# Patient Record
Sex: Male | Born: 1960 | Race: White | Hispanic: No | Marital: Married | Smoking: Never smoker
Health system: Southern US, Community
[De-identification: ages and names within clinical notes are randomized; demographics above are authoritative.]

## PROBLEM LIST (undated history)

## (undated) DIAGNOSIS — I10 Essential (primary) hypertension: Secondary | ICD-10-CM

## (undated) DIAGNOSIS — J45909 Unspecified asthma, uncomplicated: Secondary | ICD-10-CM

## (undated) DIAGNOSIS — J449 Chronic obstructive pulmonary disease, unspecified: Secondary | ICD-10-CM

## (undated) HISTORY — DX: Unspecified asthma, uncomplicated: J45.909

## (undated) HISTORY — PX: TONSILLECTOMY: SUR1361

## (undated) HISTORY — DX: Essential (primary) hypertension: I10

## (undated) HISTORY — PX: REVERSE SHOULDER ARTHROPLASTY: SHX5054

---

## 2020-08-12 DIAGNOSIS — K635 Polyp of colon: Secondary | ICD-10-CM | POA: Diagnosis not present

## 2020-08-12 DIAGNOSIS — Z86010 Personal history of colonic polyps: Secondary | ICD-10-CM | POA: Diagnosis not present

## 2020-08-12 DIAGNOSIS — K5730 Diverticulosis of large intestine without perforation or abscess without bleeding: Secondary | ICD-10-CM | POA: Diagnosis not present

## 2020-08-12 DIAGNOSIS — Z1211 Encounter for screening for malignant neoplasm of colon: Secondary | ICD-10-CM | POA: Diagnosis not present

## 2020-08-12 DIAGNOSIS — D120 Benign neoplasm of cecum: Secondary | ICD-10-CM | POA: Diagnosis not present

## 2021-04-30 DIAGNOSIS — L814 Other melanin hyperpigmentation: Secondary | ICD-10-CM | POA: Diagnosis not present

## 2021-04-30 DIAGNOSIS — L718 Other rosacea: Secondary | ICD-10-CM | POA: Diagnosis not present

## 2021-04-30 DIAGNOSIS — L578 Other skin changes due to chronic exposure to nonionizing radiation: Secondary | ICD-10-CM | POA: Diagnosis not present

## 2021-04-30 DIAGNOSIS — D225 Melanocytic nevi of trunk: Secondary | ICD-10-CM | POA: Diagnosis not present

## 2021-05-28 DIAGNOSIS — I10 Essential (primary) hypertension: Secondary | ICD-10-CM | POA: Diagnosis not present

## 2021-05-28 DIAGNOSIS — E782 Mixed hyperlipidemia: Secondary | ICD-10-CM | POA: Diagnosis not present

## 2021-05-28 DIAGNOSIS — Z0000 Encounter for general adult medical examination without abnormal findings: Secondary | ICD-10-CM | POA: Diagnosis not present

## 2021-06-01 HISTORY — PX: COLONOSCOPY: SHX174

## 2021-06-12 DIAGNOSIS — M25512 Pain in left shoulder: Secondary | ICD-10-CM | POA: Diagnosis not present

## 2021-06-13 DIAGNOSIS — M19012 Primary osteoarthritis, left shoulder: Secondary | ICD-10-CM | POA: Diagnosis not present

## 2021-06-19 ENCOUNTER — Other Ambulatory Visit: Payer: Self-pay | Admitting: Sports Medicine

## 2021-06-19 DIAGNOSIS — M25512 Pain in left shoulder: Secondary | ICD-10-CM

## 2021-07-09 ENCOUNTER — Ambulatory Visit
Admission: RE | Admit: 2021-07-09 | Discharge: 2021-07-09 | Disposition: A | Discharge status: Home / Self Care | Payer: BC Managed Care – PPO | Source: Ambulatory Visit | Attending: Sports Medicine | Admitting: Sports Medicine

## 2021-07-09 ENCOUNTER — Other Ambulatory Visit: Payer: Self-pay

## 2021-07-09 ENCOUNTER — Other Ambulatory Visit: Payer: Self-pay | Admitting: Sports Medicine

## 2021-07-09 DIAGNOSIS — M25512 Pain in left shoulder: Secondary | ICD-10-CM

## 2021-07-09 DIAGNOSIS — Z01818 Encounter for other preprocedural examination: Secondary | ICD-10-CM | POA: Diagnosis not present

## 2021-07-09 DIAGNOSIS — Z77018 Contact with and (suspected) exposure to other hazardous metals: Secondary | ICD-10-CM

## 2021-07-09 DIAGNOSIS — M19012 Primary osteoarthritis, left shoulder: Secondary | ICD-10-CM | POA: Diagnosis not present

## 2021-07-09 DIAGNOSIS — S43402A Unspecified sprain of left shoulder joint, initial encounter: Secondary | ICD-10-CM | POA: Diagnosis not present

## 2021-07-09 DIAGNOSIS — M25412 Effusion, left shoulder: Secondary | ICD-10-CM | POA: Diagnosis not present

## 2021-07-09 DIAGNOSIS — S46012A Strain of muscle(s) and tendon(s) of the rotator cuff of left shoulder, initial encounter: Secondary | ICD-10-CM | POA: Diagnosis not present

## 2021-07-15 DIAGNOSIS — M25512 Pain in left shoulder: Secondary | ICD-10-CM | POA: Diagnosis not present

## 2021-08-06 DIAGNOSIS — G8918 Other acute postprocedural pain: Secondary | ICD-10-CM | POA: Diagnosis not present

## 2021-08-06 DIAGNOSIS — M24112 Other articular cartilage disorders, left shoulder: Secondary | ICD-10-CM | POA: Diagnosis not present

## 2021-08-06 DIAGNOSIS — M75112 Incomplete rotator cuff tear or rupture of left shoulder, not specified as traumatic: Secondary | ICD-10-CM | POA: Diagnosis not present

## 2021-08-13 DIAGNOSIS — M75112 Incomplete rotator cuff tear or rupture of left shoulder, not specified as traumatic: Secondary | ICD-10-CM | POA: Diagnosis not present

## 2021-08-13 DIAGNOSIS — M19012 Primary osteoarthritis, left shoulder: Secondary | ICD-10-CM | POA: Diagnosis not present

## 2021-08-13 DIAGNOSIS — M25712 Osteophyte, left shoulder: Secondary | ICD-10-CM | POA: Diagnosis not present

## 2021-08-13 DIAGNOSIS — M65812 Other synovitis and tenosynovitis, left shoulder: Secondary | ICD-10-CM | POA: Diagnosis not present

## 2021-10-02 DIAGNOSIS — M25511 Pain in right shoulder: Secondary | ICD-10-CM | POA: Diagnosis not present

## 2021-10-07 DIAGNOSIS — M25512 Pain in left shoulder: Secondary | ICD-10-CM | POA: Diagnosis not present

## 2021-11-14 DIAGNOSIS — W11XXXD Fall on and from ladder, subsequent encounter: Secondary | ICD-10-CM | POA: Diagnosis not present

## 2021-11-14 DIAGNOSIS — M75122 Complete rotator cuff tear or rupture of left shoulder, not specified as traumatic: Secondary | ICD-10-CM | POA: Diagnosis not present

## 2021-11-14 DIAGNOSIS — M6281 Muscle weakness (generalized): Secondary | ICD-10-CM | POA: Diagnosis not present

## 2021-11-14 DIAGNOSIS — M25612 Stiffness of left shoulder, not elsewhere classified: Secondary | ICD-10-CM | POA: Diagnosis not present

## 2021-11-18 DIAGNOSIS — M25612 Stiffness of left shoulder, not elsewhere classified: Secondary | ICD-10-CM | POA: Diagnosis not present

## 2021-11-18 DIAGNOSIS — W11XXXD Fall on and from ladder, subsequent encounter: Secondary | ICD-10-CM | POA: Diagnosis not present

## 2021-11-18 DIAGNOSIS — M75122 Complete rotator cuff tear or rupture of left shoulder, not specified as traumatic: Secondary | ICD-10-CM | POA: Diagnosis not present

## 2021-11-18 DIAGNOSIS — M6281 Muscle weakness (generalized): Secondary | ICD-10-CM | POA: Diagnosis not present

## 2021-11-28 DIAGNOSIS — W11XXXD Fall on and from ladder, subsequent encounter: Secondary | ICD-10-CM | POA: Diagnosis not present

## 2021-11-28 DIAGNOSIS — M75122 Complete rotator cuff tear or rupture of left shoulder, not specified as traumatic: Secondary | ICD-10-CM | POA: Diagnosis not present

## 2021-11-28 DIAGNOSIS — M6281 Muscle weakness (generalized): Secondary | ICD-10-CM | POA: Diagnosis not present

## 2021-11-28 DIAGNOSIS — M25612 Stiffness of left shoulder, not elsewhere classified: Secondary | ICD-10-CM | POA: Diagnosis not present

## 2021-12-05 DIAGNOSIS — M6281 Muscle weakness (generalized): Secondary | ICD-10-CM | POA: Diagnosis not present

## 2021-12-05 DIAGNOSIS — M75122 Complete rotator cuff tear or rupture of left shoulder, not specified as traumatic: Secondary | ICD-10-CM | POA: Diagnosis not present

## 2021-12-05 DIAGNOSIS — M25612 Stiffness of left shoulder, not elsewhere classified: Secondary | ICD-10-CM | POA: Diagnosis not present

## 2021-12-05 DIAGNOSIS — W11XXXD Fall on and from ladder, subsequent encounter: Secondary | ICD-10-CM | POA: Diagnosis not present

## 2021-12-12 DIAGNOSIS — M6281 Muscle weakness (generalized): Secondary | ICD-10-CM | POA: Diagnosis not present

## 2021-12-12 DIAGNOSIS — W11XXXD Fall on and from ladder, subsequent encounter: Secondary | ICD-10-CM | POA: Diagnosis not present

## 2021-12-12 DIAGNOSIS — M75122 Complete rotator cuff tear or rupture of left shoulder, not specified as traumatic: Secondary | ICD-10-CM | POA: Diagnosis not present

## 2021-12-12 DIAGNOSIS — M25612 Stiffness of left shoulder, not elsewhere classified: Secondary | ICD-10-CM | POA: Diagnosis not present

## 2021-12-19 DIAGNOSIS — M75122 Complete rotator cuff tear or rupture of left shoulder, not specified as traumatic: Secondary | ICD-10-CM | POA: Diagnosis not present

## 2021-12-19 DIAGNOSIS — M6281 Muscle weakness (generalized): Secondary | ICD-10-CM | POA: Diagnosis not present

## 2021-12-19 DIAGNOSIS — M25612 Stiffness of left shoulder, not elsewhere classified: Secondary | ICD-10-CM | POA: Diagnosis not present

## 2021-12-19 DIAGNOSIS — W11XXXD Fall on and from ladder, subsequent encounter: Secondary | ICD-10-CM | POA: Diagnosis not present

## 2021-12-24 DIAGNOSIS — L718 Other rosacea: Secondary | ICD-10-CM | POA: Diagnosis not present

## 2021-12-26 DIAGNOSIS — M75122 Complete rotator cuff tear or rupture of left shoulder, not specified as traumatic: Secondary | ICD-10-CM | POA: Diagnosis not present

## 2021-12-26 DIAGNOSIS — M6281 Muscle weakness (generalized): Secondary | ICD-10-CM | POA: Diagnosis not present

## 2021-12-26 DIAGNOSIS — M25612 Stiffness of left shoulder, not elsewhere classified: Secondary | ICD-10-CM | POA: Diagnosis not present

## 2021-12-26 DIAGNOSIS — W11XXXD Fall on and from ladder, subsequent encounter: Secondary | ICD-10-CM | POA: Diagnosis not present

## 2022-01-20 ENCOUNTER — Other Ambulatory Visit: Payer: Self-pay | Admitting: Orthopaedic Surgery

## 2022-01-20 DIAGNOSIS — Z01818 Encounter for other preprocedural examination: Secondary | ICD-10-CM

## 2022-01-22 DIAGNOSIS — M25511 Pain in right shoulder: Secondary | ICD-10-CM | POA: Diagnosis not present

## 2022-01-22 DIAGNOSIS — Z01812 Encounter for preprocedural laboratory examination: Secondary | ICD-10-CM | POA: Diagnosis not present

## 2022-01-22 DIAGNOSIS — M75122 Complete rotator cuff tear or rupture of left shoulder, not specified as traumatic: Secondary | ICD-10-CM | POA: Diagnosis not present

## 2022-02-16 ENCOUNTER — Ambulatory Visit
Admission: RE | Admit: 2022-02-16 | Discharge: 2022-02-16 | Disposition: A | Discharge status: Home / Self Care | Payer: BC Managed Care – PPO | Source: Ambulatory Visit | Attending: Orthopaedic Surgery | Admitting: Orthopaedic Surgery

## 2022-02-16 DIAGNOSIS — Z01818 Encounter for other preprocedural examination: Secondary | ICD-10-CM

## 2022-02-16 DIAGNOSIS — G8929 Other chronic pain: Secondary | ICD-10-CM | POA: Diagnosis not present

## 2022-02-16 DIAGNOSIS — M75101 Unspecified rotator cuff tear or rupture of right shoulder, not specified as traumatic: Secondary | ICD-10-CM | POA: Diagnosis not present

## 2022-02-16 DIAGNOSIS — M19011 Primary osteoarthritis, right shoulder: Secondary | ICD-10-CM | POA: Diagnosis not present

## 2022-02-18 DIAGNOSIS — X58XXXA Exposure to other specified factors, initial encounter: Secondary | ICD-10-CM | POA: Diagnosis not present

## 2022-02-18 DIAGNOSIS — S46011A Strain of muscle(s) and tendon(s) of the rotator cuff of right shoulder, initial encounter: Secondary | ICD-10-CM | POA: Diagnosis not present

## 2022-02-18 DIAGNOSIS — M84321A Stress fracture, right humerus, initial encounter for fracture: Secondary | ICD-10-CM | POA: Diagnosis not present

## 2022-02-18 DIAGNOSIS — G8918 Other acute postprocedural pain: Secondary | ICD-10-CM | POA: Diagnosis not present

## 2022-02-18 DIAGNOSIS — M19011 Primary osteoarthritis, right shoulder: Secondary | ICD-10-CM | POA: Diagnosis not present

## 2022-02-18 DIAGNOSIS — Y999 Unspecified external cause status: Secondary | ICD-10-CM | POA: Diagnosis not present

## 2022-05-08 DIAGNOSIS — J101 Influenza due to other identified influenza virus with other respiratory manifestations: Secondary | ICD-10-CM | POA: Diagnosis not present

## 2022-05-08 DIAGNOSIS — Z03818 Encounter for observation for suspected exposure to other biological agents ruled out: Secondary | ICD-10-CM | POA: Diagnosis not present

## 2022-05-13 DIAGNOSIS — L821 Other seborrheic keratosis: Secondary | ICD-10-CM | POA: Diagnosis not present

## 2022-05-13 DIAGNOSIS — L718 Other rosacea: Secondary | ICD-10-CM | POA: Diagnosis not present

## 2022-05-13 DIAGNOSIS — D225 Melanocytic nevi of trunk: Secondary | ICD-10-CM | POA: Diagnosis not present

## 2022-05-13 DIAGNOSIS — L578 Other skin changes due to chronic exposure to nonionizing radiation: Secondary | ICD-10-CM | POA: Diagnosis not present

## 2022-06-09 DIAGNOSIS — M19011 Primary osteoarthritis, right shoulder: Secondary | ICD-10-CM | POA: Diagnosis not present

## 2022-06-10 ENCOUNTER — Other Ambulatory Visit: Payer: Self-pay | Admitting: Family Medicine

## 2022-06-10 ENCOUNTER — Ambulatory Visit (INDEPENDENT_AMBULATORY_CARE_PROVIDER_SITE_OTHER): Payer: BC Managed Care – PPO

## 2022-06-10 DIAGNOSIS — I10 Essential (primary) hypertension: Secondary | ICD-10-CM | POA: Diagnosis not present

## 2022-06-10 DIAGNOSIS — Z125 Encounter for screening for malignant neoplasm of prostate: Secondary | ICD-10-CM | POA: Diagnosis not present

## 2022-06-10 DIAGNOSIS — Z0000 Encounter for general adult medical examination without abnormal findings: Secondary | ICD-10-CM | POA: Diagnosis not present

## 2022-06-10 DIAGNOSIS — R059 Cough, unspecified: Secondary | ICD-10-CM | POA: Diagnosis not present

## 2022-06-10 DIAGNOSIS — R053 Chronic cough: Secondary | ICD-10-CM

## 2022-06-10 DIAGNOSIS — E782 Mixed hyperlipidemia: Secondary | ICD-10-CM | POA: Diagnosis not present

## 2022-06-10 DIAGNOSIS — H109 Unspecified conjunctivitis: Secondary | ICD-10-CM | POA: Diagnosis not present

## 2022-06-10 DIAGNOSIS — J449 Chronic obstructive pulmonary disease, unspecified: Secondary | ICD-10-CM | POA: Diagnosis not present

## 2022-06-30 DIAGNOSIS — R053 Chronic cough: Secondary | ICD-10-CM | POA: Diagnosis not present

## 2022-07-02 ENCOUNTER — Other Ambulatory Visit: Payer: Self-pay | Admitting: Family Medicine

## 2022-07-02 DIAGNOSIS — R053 Chronic cough: Secondary | ICD-10-CM

## 2022-07-30 ENCOUNTER — Ambulatory Visit
Admission: RE | Admit: 2022-07-30 | Discharge: 2022-07-30 | Disposition: A | Discharge status: Home / Self Care | Payer: BC Managed Care – PPO | Source: Ambulatory Visit | Attending: Family Medicine | Admitting: Family Medicine

## 2022-07-30 DIAGNOSIS — R053 Chronic cough: Secondary | ICD-10-CM

## 2022-07-30 DIAGNOSIS — J9811 Atelectasis: Secondary | ICD-10-CM | POA: Diagnosis not present

## 2022-08-03 DIAGNOSIS — R590 Localized enlarged lymph nodes: Secondary | ICD-10-CM | POA: Diagnosis not present

## 2022-09-01 ENCOUNTER — Ambulatory Visit (INDEPENDENT_AMBULATORY_CARE_PROVIDER_SITE_OTHER): Payer: BC Managed Care – PPO | Admitting: Pulmonary Disease

## 2022-09-01 ENCOUNTER — Encounter: Payer: Self-pay | Admitting: Pulmonary Disease

## 2022-09-01 VITALS — BP 128/80 | HR 50 | Temp 98.1°F | Ht 78.0 in | Wt 258.0 lb

## 2022-09-01 DIAGNOSIS — J4520 Mild intermittent asthma, uncomplicated: Secondary | ICD-10-CM | POA: Diagnosis not present

## 2022-09-01 DIAGNOSIS — R053 Chronic cough: Secondary | ICD-10-CM | POA: Diagnosis not present

## 2022-09-01 MED ORDER — FLUTICASONE-SALMETEROL 115-21 MCG/ACT IN AERO: 2.0000 | INHALATION_SPRAY | Freq: Two times a day (BID) | RESPIRATORY_TRACT | 12 refills | Status: DC

## 2022-09-01 NOTE — Progress Notes (Signed)
Synopsis: Referred in March 2024 for cough by Aura Dials, MD  Subjective:   PATIENT ID: Collin [name], Collin [name]  HPI  Chief Complaint  Patient presents with   Pulmonary Consult    Referred by Dr Sheryn Bison for eval of chronic cough.  He c/o cough x 40 years. He got worse in Sept 2023 and started to improve some by end of Feb 2024. His cough is mainly non prod, except does cough up some clear to white sputum in the shower every am.    Collin [name] is a 62 year old male, never smoker with history of hypertension who is referred to pulmonary clinic for chronic cough.   The cough has been present for a couple of years. He has mucous production that is clear/whitish and some wheezing after coughing episode. The cough occurs randomonly. He was coughing October through February. The cough has been better over the last month. Cold airs bother his cough. Denies heartburn or reflux. He has not tried an inhalers in the past.   He is a never smoker. He had second hand smoke exposure in childhood. 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 He has been using automotive paints and using polyurethane foams, saw dust, etc. He has a dog. Lives with his wife. No family history of lung disease. Sister with breast cancer.  Past Medical History:  Diagnosis Date   Hypertension      No family history on file.   Social History   Socioeconomic History   Marital status: Married    Spouse name: Not on file   Number of children: Not on file   Years of education: Not on file   Highest education level: Not on file  Occupational History   Not on file  Tobacco Use   Smoking status: Never    Passive exposure: Past   Smokeless tobacco: Never  Vaping Use   Vaping Use: Never used  Substance and Sexual Activity   Alcohol use: Not on file   Drug use: Not on file   Sexual activity: Not on file  Other Topics Concern   Not on file  Social History  Narrative   Not on file   Social Determinants of Health   Financial Resource Strain: Not on file  Food Insecurity: Not on file  Transportation Needs: Not on file  Physical Activity: Not on file  Stress: Not on file  Social Connections: Not on file  Intimate Partner Violence: Not on file     No Known Allergies   Outpatient Medications Prior to Visit  Medication Sig Dispense Refill   doxycycline (VIBRAMYCIN) 100 MG capsule Take 100 mg by mouth daily.     fenofibrate micronized (LOFIBRA) 134 MG capsule Take 134 mg by mouth daily.     losartan (COZAAR) 50 MG tablet Take 50 mg by mouth daily.     No facility-administered medications prior to visit.    Review of Systems  Constitutional:  Negative for chills, fever, malaise/fatigue and weight loss.  HENT:  Negative for congestion, sinus pain and sore throat.   Eyes: Negative.   Respiratory:  Positive for cough. Negative for hemoptysis, sputum production, shortness of breath and wheezing.   Cardiovascular:  Negative for chest pain, palpitations, orthopnea, claudication and leg swelling.  Gastrointestinal:  Negative for abdominal pain, heartburn, nausea and vomiting.  Genitourinary: Negative.   Musculoskeletal:  Negative for joint pain and myalgias.  Skin:  Negative for rash.  Neurological:  Negative for weakness.  Endo/Heme/Allergies: Negative.   Psychiatric/Behavioral: Negative.     Objective:   Vitals:   09/01/22 0829  BP: 128/80  Pulse: (!) 50  Temp: 98.1 F (36.7 C)  TempSrc: Oral  SpO2: 97%  Weight: 258 lb (117 kg)  Height: 6\' 6"  (1.981 m)   Physical Exam Constitutional:      General: He is not in acute distress. HENT:     Head: Normocephalic and atraumatic.  Eyes:     Conjunctiva/sclera: Conjunctivae normal.     Pupils: Pupils are equal, round, and reactive to light.  Cardiovascular:     Rate and Rhythm: Normal rate and regular rhythm.     Pulses: Normal pulses.     Heart sounds: Normal heart sounds. No  murmur heard. Pulmonary:     Effort: Pulmonary effort is normal.     Breath sounds: Normal breath sounds. No wheezing, rhonchi or rales.  Musculoskeletal:     Right lower leg: No edema.     Left lower leg: No edema.  Skin:    General: Skin is warm and dry.  Neurological:     General: No focal deficit present.     Mental Status: He is alert.  Psychiatric:        Mood and Affect: Mood normal.        Behavior: Behavior normal.        Thought Content: Thought content normal.        Judgment: Judgment normal.     CBC No results found for: "WBC", "RBC", "HGB", "HCT", "PLT", "MCV", "MCH", "MCHC", "RDW", "LYMPHSABS", "MONOABS", "EOSABS", "BASOSABS"   Chest imaging: CT Chest 07/30/22 Cardiovascular: The heart is normal in size and there is no pericardial effusion. The aorta and pulmonary trunk are normal in caliber.   Mediastinum/Nodes: No mediastinal or axillary lymphadenopathy. Evaluation of the hila is limited due to lack of IV contrast. The thyroid gland, trachea, and esophagus are within normal limits.   Lungs/Pleura: Minimal atelectasis or scarring is noted bilaterally. No effusion or pneumothorax. No significant pulmonary nodule or mass.  CXR 06/10/22 The heart size and mediastinal contours are within normal limits. Pulmonary hyperinflation is consistent with COPD. No evidence of pulmonary infiltrate or edema. No evidence of pleural effusion. 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   He appears to have asthmatic  bronchitis vs asthma. He has airways on his CT scan that is nearing a bronchiectatic appearance. We will continue to monitor this.  He is to start advair HFA 115-68mcg 2 puffs twice daily.   Follow up in 2 months with pulmonary function tests.  Freda Jackson, MD [facility] Office: [phone]   Current Outpatient Medications:    doxycycline (VIBRAMYCIN) 100 MG capsule, Take 100 mg by mouth daily., Disp: , Rfl:    fluticasone-salmeterol (ADVAIR HFA) 115-21 MCG/ACT inhaler, Inhale 2 puffs into the lungs 2 (two) times daily., Disp: 1 each, Rfl: 12   fenofibrate micronized (  LOFIBRA) 134 MG capsule, Take 134 mg by mouth daily., Disp: , Rfl:    losartan (COZAAR) 50 MG tablet, Take 50 mg by mouth daily., Disp: , Rfl:

## 2022-09-01 NOTE — Patient Instructions (Signed)
Your CT scan shows some chronic inflammation of the airways and I think your cough is coming from a chronic bronchitis vs asthma.   Start advair HFA inhaler 2 puffs twice daily - rinse mouth out after each use  Return in 2 months for breathing tests

## 2022-09-03 DIAGNOSIS — M19011 Primary osteoarthritis, right shoulder: Secondary | ICD-10-CM | POA: Diagnosis not present

## 2022-09-03 DIAGNOSIS — M25512 Pain in left shoulder: Secondary | ICD-10-CM | POA: Diagnosis not present

## 2022-11-03 ENCOUNTER — Encounter: Payer: Self-pay | Admitting: Pulmonary Disease

## 2022-11-03 ENCOUNTER — Ambulatory Visit (INDEPENDENT_AMBULATORY_CARE_PROVIDER_SITE_OTHER): Payer: BC Managed Care – PPO | Admitting: Pulmonary Disease

## 2022-11-03 ENCOUNTER — Ambulatory Visit: Payer: BC Managed Care – PPO | Admitting: Pulmonary Disease

## 2022-11-03 VITALS — BP 130/64 | HR 56 | Ht 79.0 in | Wt 263.8 lb

## 2022-11-03 DIAGNOSIS — R053 Chronic cough: Secondary | ICD-10-CM | POA: Diagnosis not present

## 2022-11-03 DIAGNOSIS — J449 Chronic obstructive pulmonary disease, unspecified: Secondary | ICD-10-CM

## 2022-11-03 LAB — PULMONARY FUNCTION TEST
DL/VA % pred: 85 %
DL/VA: 3.44 ml/min/mmHg/L
DLCO cor % pred: 100 %
DLCO cor: 35.57 ml/min/mmHg
DLCO unc % pred: 100 %
DLCO unc: 35.57 ml/min/mmHg
FEF 25-75 Post: 3.59 L/sec
FEF 25-75 Pre: 2.98 L/sec
FEF2575-%Change-Post: 20 %
FEF2575-%Pred-Post: 95 %
FEF2575-%Pred-Pre: 79 %
FEV1-%Change-Post: 9 %
FEV1-%Pred-Post: 92 %
FEV1-%Pred-Pre: 84 %
FEV1-Post: 4.39 L
FEV1-Pre: 4.01 L
FEV1FVC-%Change-Post: 5 %
FEV1FVC-%Pred-Pre: 87 %
FEV6-%Change-Post: 4 %
FEV6-%Pred-Post: 103 %
FEV6-%Pred-Pre: 98 %
FEV6-Post: 6.24 L
FEV6-Pre: 5.98 L
FEV6FVC-%Change-Post: 0 %
FEV6FVC-%Pred-Post: 103 %
FEV6FVC-%Pred-Pre: 102 %
FVC-%Change-Post: 3 %
FVC-%Pred-Post: 99 %
FVC-%Pred-Pre: 96 %
FVC-Post: 6.32 L
FVC-Pre: 6.08 L
Post FEV1/FVC ratio: 69 %
Post FEV6/FVC ratio: 99 %
Pre FEV1/FVC ratio: 66 %
Pre FEV6/FVC Ratio: 98 %
RV % pred: 146 %
RV: 4.07 L
TLC % pred: 120 %
TLC: 10.61 L

## 2022-11-03 MED ORDER — ALBUTEROL SULFATE HFA 108 (90 BASE) MCG/ACT IN AERS: 2.0000 | INHALATION_SPRAY | Freq: Four times a day (QID) | RESPIRATORY_TRACT | 6 refills | Status: DC | PRN

## 2022-11-03 MED ORDER — TRELEGY ELLIPTA 100-62.5-25 MCG/ACT IN AEPB: 1.0000 | INHALATION_SPRAY | Freq: Every day | RESPIRATORY_TRACT | 0 refills | Status: DC

## 2022-11-03 NOTE — Progress Notes (Signed)
Full PFT performed today. °

## 2022-11-03 NOTE — Progress Notes (Signed)
Synopsis: Referred in March 2024 for cough by Henrine Screws, MD  Subjective:   PATIENT ID: Collin [name] DOB: 19-Apr-1961, MRN: [id number]  HPI  Chief Complaint  Patient presents with   Follow-up    F/U after PFT. States the cough has improved some since last visit. Denies any increased SOB.    Collin [name] is a 62 year old [name], never smoker with history of hypertension who returns to pulmonary clinic for chronic cough.   Initial OV 09/01/22 The cough has been present for a couple of years. He has mucous production that is clear/whitish and some wheezing after coughing episode. The cough occurs randomonly. He was coughing October through February. The cough has been better over the last month. Cold airs bother his cough. Denies heartburn or reflux. He has not tried an inhalers in the past.   He is a never smoker. He had second hand smoke exposure in childhood. 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 He has been using automotive paints and using polyurethane foams, saw dust, etc. He has a dog. Lives with his wife. No family history of lung disease. Sister with breast cancer.  Today - OV 09/01/22 He was started on advair HFA 115-23mcg 2 puffs twice daily at last visit. He reports his cough has improved and some improvement of the dyspnea. He remains active going on motorcycle trips.  PFTs show mild obstruction with air trapping  Past Medical History:  Diagnosis Date   Hypertension      No family history on file.   Social History   Socioeconomic History   Marital status: Married    Spouse name: Not on file   Number of children: Not on file   Years of education: Not on file   Highest education level: Not on file  Occupational History   Not on file  Tobacco Use   Smoking status: Never    Passive exposure: Past   Smokeless tobacco: Never  Vaping Use   Vaping Use: Never used  Substance and Sexual Activity   Alcohol use: Not on file    Drug use: Not on file   Sexual activity: Not on file  Other Topics Concern   Not on file  Social History Narrative   Not on file   Social Determinants of Health   Financial Resource Strain: Not on file  Food Insecurity: Not on file  Transportation Needs: Not on file  Physical Activity: Not on file  Stress: Not on file  Social Connections: Not on file  Intimate Partner Violence: Not on file     No Known Allergies   Outpatient Medications Prior to Visit  Medication Sig Dispense Refill   fenofibrate micronized (LOFIBRA) 134 MG capsule Take 134 mg by mouth daily.     fluticasone-salmeterol (ADVAIR HFA) 115-21 MCG/ACT inhaler Inhale 2 puffs into the lungs 2 (two) times daily. 1 each 12   losartan (COZAAR) 50 MG tablet Take 50 mg by mouth daily.     doxycycline (VIBRAMYCIN) 100 MG capsule Take 100 mg by mouth daily.     No facility-administered medications prior to visit.    Review of Systems  Constitutional:  Negative for chills, fever, malaise/fatigue and weight loss.  HENT:  Negative for congestion, sinus pain and sore throat.   Eyes: Negative.   Respiratory:  Positive for cough and shortness of breath. Negative for hemoptysis, sputum production and wheezing.   Cardiovascular:  Negative for  chest pain, palpitations, orthopnea, claudication and leg swelling.  Gastrointestinal:  Negative for abdominal pain, heartburn, nausea and vomiting.  Genitourinary: Negative.   Musculoskeletal:  Negative for joint pain and myalgias.  Skin:  Negative for rash.  Neurological:  Negative for weakness.  Endo/Heme/Allergies: Negative.   Psychiatric/Behavioral: Negative.     Objective:   Vitals:   11/03/22 1457  BP: 130/64  Pulse: (!) 56  SpO2: 98%  Weight: 263 lb 12.8 oz (119.7 kg)  Height: 6\' 7"  (2.007 m)   Physical Exam Constitutional:      General: He is not in acute distress. HENT:     Head: Normocephalic and atraumatic.  Eyes:     Conjunctiva/sclera: Conjunctivae normal.      Pupils: Pupils are equal, round, and reactive to light.  Cardiovascular:     Rate and Rhythm: Normal rate and regular rhythm.     Pulses: Normal pulses.     Heart sounds: Normal heart sounds. No murmur heard. Pulmonary:     Effort: Pulmonary effort is normal.     Breath sounds: Normal breath sounds. No wheezing, rhonchi or rales.  Musculoskeletal:     Right lower leg: No edema.     Left lower leg: No edema.  Skin:    General: Skin is warm and dry.  Neurological:     General: No focal deficit present.     Mental Status: He is alert.     CBC No results found for: "WBC", "RBC", "HGB", "HCT", "PLT", "MCV", "MCH", "MCHC", "RDW", "LYMPHSABS", "MONOABS", "EOSABS", "BASOSABS"   Chest imaging: CT Chest 07/30/22 Cardiovascular: The heart is normal in size and there is no pericardial effusion. The aorta and pulmonary trunk are normal in caliber.   Mediastinum/Nodes: No mediastinal or axillary lymphadenopathy. Evaluation of the hila is limited due to lack of IV contrast. The thyroid gland, trachea, and esophagus are within normal limits.   Lungs/Pleura: Minimal atelectasis or scarring is noted bilaterally. No effusion or pneumothorax. No significant pulmonary nodule or mass.  CXR 06/10/22 The heart size and mediastinal contours are within normal limits. Pulmonary hyperinflation is consistent with COPD. No evidence of pulmonary infiltrate or edema. No evidence of pleural effusion. 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  PFT:    Latest Ref Rng & Units 11/03/2022    1:42 PM  PFT Results  FVC-Pre L 6.08  P  FVC-Predicted Pre % 96  P  FVC-Post L 6.32  P  FVC-Predicted Post % 99  P  Pre FEV1/FVC % % 66  P  Post FEV1/FCV % % 69  P  FEV1-Pre L 4.01  P  FEV1-Predicted Pre % 84  P  FEV1-Post L 4.39  P  DLCO uncorrected ml/min/mmHg 35.57  P  DLCO UNC% % 100  P  DLCO corrected ml/min/mmHg 35.57  P  DLCO COR %Predicted % 100  P  DLVA Predicted % 85  P  TLC L 10.61  P  TLC %  Predicted % 120  P  RV % Predicted % 146  P    P Preliminary result    Labs 06/2022: Cr 1.2 CO2 28  Path:  Echo:  Heart Catheterization:  Assessment & Plan:   Chronic obstructive pulmonary disease, unspecified COPD type (HCC) - Plan: albuterol (VENTOLIN HFA) 108 (90 Base) MCG/ACT inhaler  Discussion: Collin [name] is a 62 year old [name], never smoker with history of hypertension who returns to pulmonary clinic for COPD.   He has COPD based on PFTs today with mild  obstructive defect and air trapping.   He is to try trelegy ellipta 1 puff daily and let us know if he would like to continue on this inhaler.   He can use albuterol inhaler as needed 1-2 puffs every 4-6 hours as needed.   Follow up in 1 year, call sooner if needed.  Melody Comas, MD [facility] Office: [phone]   Current Outpatient Medications:    albuterol (VENTOLIN HFA) 108 (90 Base) MCG/ACT inhaler, Inhale 2 puffs into the lungs every 6 (six) hours as needed for wheezing or shortness of breath., Disp: 8 g, Rfl: 6   fenofibrate micronized (LOFIBRA) 134 MG capsule, Take 134 mg by mouth daily., Disp: , Rfl:    fluticasone-salmeterol (ADVAIR HFA) 115-21 MCG/ACT inhaler, Inhale 2 puffs into the lungs 2 (two) times daily., Disp: 1 each, Rfl: 12   Fluticasone-Umeclidin-Vilant (TRELEGY ELLIPTA) 100-62.5-25 MCG/ACT AEPB, Inhale 1 puff into the lungs daily., Disp: 1 each, Rfl: 0   losartan (COZAAR) 50 MG tablet, Take 50 mg by mouth daily., Disp: , Rfl:

## 2022-11-03 NOTE — Patient Instructions (Signed)
Full PFT performed today. °

## 2022-11-03 NOTE — Patient Instructions (Addendum)
Your breathing tests show mild COPD  Try trelegy ellipta 1 puff daily - rinse mouth out after each use - send [place] a myChart message if you would like to continue this inhaler  Use albuterol inhaler 1-2 puffs every 4-6 hours as needed  Follow up in 1 year, call sooner if needed

## 2022-11-09 DIAGNOSIS — R221 Localized swelling, mass and lump, neck: Secondary | ICD-10-CM | POA: Diagnosis not present

## 2022-11-23 ENCOUNTER — Other Ambulatory Visit: Payer: Self-pay | Admitting: Pulmonary Disease

## 2022-11-23 DIAGNOSIS — J4520 Mild intermittent asthma, uncomplicated: Secondary | ICD-10-CM

## 2022-12-01 DIAGNOSIS — J984 Other disorders of lung: Secondary | ICD-10-CM | POA: Diagnosis not present

## 2022-12-01 DIAGNOSIS — M4854XD Collapsed vertebra, not elsewhere classified, thoracic region, subsequent encounter for fracture with routine healing: Secondary | ICD-10-CM | POA: Diagnosis not present

## 2022-12-01 DIAGNOSIS — I6521 Occlusion and stenosis of right carotid artery: Secondary | ICD-10-CM | POA: Diagnosis not present

## 2022-12-01 DIAGNOSIS — M438X4 Other specified deforming dorsopathies, thoracic region: Secondary | ICD-10-CM | POA: Diagnosis not present

## 2022-12-01 DIAGNOSIS — R221 Localized swelling, mass and lump, neck: Secondary | ICD-10-CM | POA: Diagnosis not present

## 2022-12-01 DIAGNOSIS — K115 Sialolithiasis: Secondary | ICD-10-CM | POA: Diagnosis not present

## 2022-12-01 DIAGNOSIS — J439 Emphysema, unspecified: Secondary | ICD-10-CM | POA: Diagnosis not present

## 2022-12-01 DIAGNOSIS — M47812 Spondylosis without myelopathy or radiculopathy, cervical region: Secondary | ICD-10-CM | POA: Diagnosis not present

## 2022-12-22 DIAGNOSIS — K115 Sialolithiasis: Secondary | ICD-10-CM | POA: Diagnosis not present

## 2023-01-04 NOTE — H&P (Signed)
HPI:   Collin [name] is a 62 y.o. male who presents as a consult Patient.   Referring Provider: No ref. provider found  Chief complaint: Neck swelling.  HPI: A few months ago he developed painful swelling of the right submandibular area. He was treated with a course of antibiotic. He also developed something under his tongue on the right side. It did not swell up repeatedly with meals. He does not smoke. Otherwise in pretty good health. The painful swelling has subsided but he still has a lump in the right submandibular area and something under his tongue.  PMH/Meds/All/SocHx/FamHx/ROS:   History reviewed. No pertinent past medical history.  History reviewed. No pertinent surgical history.  No family history of bleeding disorders, wound healing problems or difficulty with anesthesia.     Current Outpatient Medications:  albuterol HFA (PROVENTIL HFA;VENTOLIN HFA;PROAIR HFA) 90 mcg/actuation inhaler, Inhale 2 puffs every 6 (six) hours as needed., Disp: , Rfl:  fenofibrate micronized (LOFIBRA) 134 mg capsule, Take 134 mg by mouth., Disp: , Rfl:  fluticasone propion-salmeteroL (ADVAIR HFA) 115-21 mcg/actuation inhaler, Inhale 2 puffs 2 (two) times a day., Disp: , Rfl:  fluticasone-umeclidinium-vilanterol (Trelegy Ellipta) 100-62.5-25 mcg inhaler, Inhale 1 puff daily., Disp: , Rfl:  losartan (COZAAR) 50 mg tablet, Take 50 mg by mouth daily., Disp: , Rfl:   A complete ROS was performed with pertinent positives/negatives noted in the HPI. The remainder of the ROS are negative.   Physical Exam:   Pulse 56  Temp 96.9 F (36.1 C) (Temporal)  Resp 18  Ht 2.007 m (6\' 7" )  Wt 121 kg (265 lb 12.8 oz)  SpO2 97%  BMI 29.94 kg/m   General: Healthy and alert, in no distress, breathing easily. Normal affect. In a pleasant mood. Head: Normocephalic, atraumatic. No masses, or scars. Eyes: Pupils are equal, and reactive to light. Vision is grossly intact. No spontaneous or gaze nystagmus. Ears:  Ear canals are clear. Tympanic membranes are intact, with normal landmarks and the middle ears are clear and healthy. Hearing: Grossly normal. Nose: Nasal cavities are clear with healthy mucosa, no polyps or exudate. Airways are patent. Face: No masses or scars, facial nerve function is symmetric. Oral Cavity: There is a palpable firm elongated mass along the course of the submandibular duct on the right side, otherwise no no mucosal abnormalities are noted. Tongue with normal mobility. Dentition appears healthy. Oropharynx: Tonsils are symmetric. There are no mucosal masses identified. Tongue base appears normal and healthy. Larynx/Hypopharynx: deferred Chest: Deferred Neck: There is a firm 2 cm mass along the right submandibular gland, otherwise no palpable masses, no cervical adenopathy, no thyroid nodules or enlargement. Neuro: Cranial nerves II-XII with normal function. Balance: Normal gate. Other findings: none.  Independent Review of Additional Tests or Records:  none  Procedures:  none  Impression & Plans:  Submandibular mass with a floor of mouth mass. This is concerning for a calculus although without having repeated symptoms related to meals is a little unusual. Recommend a CT with contrast to further evaluate these 2 lesions and then we will discuss treatment recommendations following that.

## 2023-01-20 ENCOUNTER — Encounter (HOSPITAL_COMMUNITY): Payer: Self-pay

## 2023-01-20 NOTE — Progress Notes (Signed)
Surgical Instructions   Your procedure is scheduled on Friday January 29, 2023. Report to [facility] Main Entrance "A" at 1:00 PM, then check in with the Admitting office. Any questions or running late day of surgery: call [phone]  Questions prior to your surgery date: call [phone], Monday-Friday, 8am-4pm. If you experience any cold or flu symptoms such as cough, fever, chills, shortness of breath, etc. between now and your scheduled surgery, please notify us at the above number.     Remember:  Do not eat or drink after midnight the night before your surgery   Take these medicines the morning of surgery with A SIP OF WATER  fenofibrate micronized (LOFIBRA)  fluticasone-salmeterol (ADVAIR HFA) 115-21 MCG/ACT inhaler    May take these medicines IF NEEDED: albuterol (VENTOLIN HFA) 108 (90 Base) MCG/ACT inhaler . Please bring with you to the hospital.    Follow your surgeon's instructions on when to stop Aspirin.  If no instructions were given by your surgeon then you will need to call the office to get those instructions.     One week prior to surgery, STOP taking any Aspirin (unless otherwise instructed by your surgeon) Aleve, Naproxen, Ibuprofen, Motrin, Advil, Goody's, BC's, all herbal medications, fish oil, and non-prescription vitamins.                     Do NOT Smoke (Tobacco/Vaping) for 24 hours prior to your procedure.  If you use a CPAP at night, you may bring your mask/headgear for your overnight stay.   You will be asked to remove any contacts, glasses, piercing's, hearing aid's, dentures/partials prior to surgery. Please bring cases for these items if needed.    Patients discharged the day of surgery will not be allowed to drive home, and someone needs to stay with them for 24 hours.  SURGICAL WAITING ROOM VISITATION Patients may have no more than 2 support people in the waiting area - these visitors may rotate.   Pre-op nurse will coordinate an appropriate  time for 1 ADULT support person, who may not rotate, to accompany patient in pre-op.  Children under the age of 34 must have an adult with them who is not the patient and must remain in the main waiting area with an adult.  If the patient needs to stay at the hospital during part of their recovery, the visitor guidelines for inpatient rooms apply.  Please refer to the [facility] website for the visitor guidelines for any additional information.   If you received a COVID test during your pre-op visit  it is requested that you wear a mask when out in public, stay away from anyone that may not be feeling well and notify your surgeon if you develop symptoms. If you have been in contact with anyone that has tested positive in the last 10 days please notify you surgeon.      Pre-operative CHG Bathing Instructions   You can play a key role in reducing the risk of infection after surgery. Your skin needs to be as free of germs as possible. You can reduce the number of germs on your skin by washing with CHG (chlorhexidine gluconate) soap before surgery. CHG is an antiseptic soap that kills germs and continues to kill germs even after washing.   DO NOT use if you have an allergy to chlorhexidine/CHG or antibacterial soaps. If your skin becomes reddened or irritated, stop using the CHG and notify one of our RNs at [phone].  TAKE A SHOWER THE NIGHT BEFORE SURGERY AND THE DAY OF SURGERY    Please keep in mind the following:  DO NOT shave, including legs and underarms, 48 hours prior to surgery.   You may shave your face before/day of surgery.  Place clean sheets on your bed the night before surgery Use a clean washcloth (not used since being washed) for each shower. DO NOT sleep with pet's night before surgery.  CHG Shower Instructions:  If you choose to wash your hair and private area, wash first with your normal shampoo/soap.  After you use shampoo/soap, rinse your hair and body  thoroughly to remove shampoo/soap residue.  Turn the water OFF and apply half the bottle of CHG soap to a CLEAN washcloth.  Apply CHG soap ONLY FROM YOUR NECK DOWN TO YOUR TOES (washing for 3-5 minutes)  DO NOT use CHG soap on face, private areas, open wounds, or sores.  Pay special attention to the area where your surgery is being performed.  If you are having back surgery, having someone wash your back for you may be helpful. Wait 2 minutes after CHG soap is applied, then you may rinse off the CHG soap.  Pat dry with a clean towel  Put on clean pajamas    Additional instructions for the day of surgery: DO NOT APPLY any lotions, deodorants or cologne. Do not bring valuables to the hospital. [facility] is not responsible for valuables/personal belongings. Put on clean/comfortable clothes.  Please brush your teeth.  Ask your nurse before applying any prescription medications to the skin.

## 2023-01-21 ENCOUNTER — Encounter (HOSPITAL_COMMUNITY): Payer: Self-pay

## 2023-01-21 ENCOUNTER — Other Ambulatory Visit: Payer: Self-pay

## 2023-01-21 ENCOUNTER — Encounter (HOSPITAL_COMMUNITY)
Admission: RE | Admit: 2023-01-21 | Discharge: 2023-01-21 | Disposition: A | Discharge status: Home / Self Care | Payer: BC Managed Care – PPO | Source: Ambulatory Visit | Attending: Otolaryngology | Admitting: Otolaryngology

## 2023-01-21 VITALS — BP 155/79 | HR 56 | Temp 98.2°F | Resp 18 | Ht 78.0 in | Wt 261.9 lb

## 2023-01-21 DIAGNOSIS — I2510 Atherosclerotic heart disease of native coronary artery without angina pectoris: Secondary | ICD-10-CM | POA: Diagnosis not present

## 2023-01-21 DIAGNOSIS — R001 Bradycardia, unspecified: Secondary | ICD-10-CM | POA: Diagnosis not present

## 2023-01-21 DIAGNOSIS — Z01818 Encounter for other preprocedural examination: Secondary | ICD-10-CM | POA: Insufficient documentation

## 2023-01-21 HISTORY — DX: Chronic obstructive pulmonary disease, unspecified: J44.9

## 2023-01-21 LAB — BASIC METABOLIC PANEL
Anion gap: 7 (ref 5–15)
BUN: 20 mg/dL (ref 8–23)
CO2: 25 mmol/L (ref 22–32)
Calcium: 9.3 mg/dL (ref 8.9–10.3)
Chloride: 103 mmol/L (ref 98–111)
Creatinine, Ser: 1.3 mg/dL — ABNORMAL HIGH (ref 0.61–1.24)
GFR, Estimated: >60 mL/min (ref >60)
Glucose, Bld: 104 mg/dL — ABNORMAL HIGH (ref 70–99)
Potassium: 4 mmol/L (ref 3.5–5.1)
Sodium: 135 mmol/L (ref 135–145)

## 2023-01-21 LAB — CBC
HCT: 45.7 % (ref 39.0–52.0)
Hemoglobin: 15 g/dL (ref 13.0–17.0)
MCH: 31.8 pg (ref 26.0–34.0)
MCHC: 32.8 g/dL (ref 30.0–36.0)
MCV: 96.8 fL (ref 80.0–100.0)
Platelets: 232 10*3/uL (ref 150–400)
RBC: 4.72 MIL/uL (ref 4.22–5.81)
RDW: 12.9 % (ref 11.5–15.5)
WBC: 7.3 10*3/uL (ref 4.0–10.5)
nRBC: 0 % (ref 0.0–0.2)

## 2023-01-21 NOTE — Progress Notes (Signed)
PCP - Dr. Joycelyn Rua at [facility] Cardiologist - Denies  PPM/ICD - Denies Device Orders - n/a Rep Notified - n/a  Chest x-ray - 06/11/2022 EKG - 01/21/2023 Stress Test - Denies ECHO - Denies Cardiac Cath - Denies  Sleep Study - Denies CPAP - n/a  No DM  Last dose of GLP1 agonist- n/a GLP1 instructions: n/a   Blood Thinner Instructions: n/a Aspirin Instructions: Pt to stop ASA one week prior to surgery. Last dose is August 22nd  NPO after midnight  COVID TEST- n/a   Anesthesia review: No.  Patient denies shortness of breath, fever, cough and chest pain at PAT appointment. Pt denies any respiratory illness/infection in the last two months. He does endorse a chronic non-productive cough related to COPD. He has never smoked, but doctors determined he developed COPD from inhalents at his job.   All instructions explained to the patient, with a verbal understanding of the material. Patient agrees to go over the instructions while at home for a better understanding. Patient also instructed to self quarantine after being tested for COVID-19. The opportunity to ask questions was provided.

## 2023-01-29 ENCOUNTER — Other Ambulatory Visit: Payer: Self-pay

## 2023-01-29 ENCOUNTER — Encounter (HOSPITAL_COMMUNITY)
Admission: RE | Disposition: A | Discharge status: Home / Self Care | Payer: Self-pay | Source: Ambulatory Visit | Attending: Otolaryngology

## 2023-01-29 ENCOUNTER — Ambulatory Visit (HOSPITAL_COMMUNITY): Payer: BC Managed Care – PPO | Admitting: Anesthesiology

## 2023-01-29 ENCOUNTER — Ambulatory Visit (HOSPITAL_COMMUNITY)
Admission: RE | Admit: 2023-01-29 | Discharge: 2023-01-29 | Disposition: A | Discharge status: Home / Self Care | Payer: BC Managed Care – PPO | Source: Ambulatory Visit | Attending: Otolaryngology | Admitting: Otolaryngology

## 2023-01-29 ENCOUNTER — Encounter (HOSPITAL_COMMUNITY): Payer: Self-pay | Admitting: Otolaryngology

## 2023-01-29 DIAGNOSIS — I10 Essential (primary) hypertension: Secondary | ICD-10-CM | POA: Diagnosis not present

## 2023-01-29 DIAGNOSIS — R221 Localized swelling, mass and lump, neck: Secondary | ICD-10-CM | POA: Diagnosis not present

## 2023-01-29 DIAGNOSIS — K115 Sialolithiasis: Secondary | ICD-10-CM | POA: Diagnosis not present

## 2023-01-29 DIAGNOSIS — J449 Chronic obstructive pulmonary disease, unspecified: Secondary | ICD-10-CM | POA: Insufficient documentation

## 2023-01-29 DIAGNOSIS — K1120 Sialoadenitis, unspecified: Secondary | ICD-10-CM | POA: Diagnosis not present

## 2023-01-29 HISTORY — PX: SUBMANDIBULAR GLAND EXCISION: SHX2456

## 2023-01-29 SURGERY — EXCISION, SUBMANDIBULAR GLAND
Anesthesia: General | Site: Neck | Laterality: Right

## 2023-01-29 MED ORDER — ACETAMINOPHEN 10 MG/ML IV SOLN
INTRAVENOUS | Status: AC
  Administered 2023-01-29: 1000 mg via INTRAVENOUS
  Filled 2023-01-29: qty 100

## 2023-01-29 MED ORDER — ONDANSETRON HCL 4 MG/2ML IJ SOLN
INTRAMUSCULAR | Status: AC
  Filled 2023-01-29: qty 2

## 2023-01-29 MED ORDER — FENTANYL CITRATE (PF) 250 MCG/5ML IJ SOLN
INTRAMUSCULAR | Status: DC | PRN
  Administered 2023-01-29: 150 ug via INTRAVENOUS
  Administered 2023-01-29: 100 ug via INTRAVENOUS

## 2023-01-29 MED ORDER — FENTANYL CITRATE (PF) 250 MCG/5ML IJ SOLN
INTRAMUSCULAR | Status: AC
  Filled 2023-01-29: qty 5

## 2023-01-29 MED ORDER — ORAL CARE MOUTH RINSE: 15.0000 mL | Freq: Once | OROMUCOSAL | Status: AC

## 2023-01-29 MED ORDER — FENTANYL CITRATE (PF) 100 MCG/2ML IJ SOLN: 25.0000 ug | INTRAMUSCULAR | Status: DC | PRN

## 2023-01-29 MED ORDER — OXYCODONE HCL 5 MG PO TABS: 5.0000 mg | ORAL_TABLET | Freq: Once | ORAL | Status: DC | PRN

## 2023-01-29 MED ORDER — LIDOCAINE 2% (20 MG/ML) 5 ML SYRINGE
INTRAMUSCULAR | Status: AC
  Filled 2023-01-29: qty 5

## 2023-01-29 MED ORDER — HYDROCODONE-ACETAMINOPHEN 7.5-325 MG PO TABS: 1.0000 | ORAL_TABLET | Freq: Four times a day (QID) | ORAL | 0 refills | Status: DC | PRN

## 2023-01-29 MED ORDER — LIDOCAINE 2% (20 MG/ML) 5 ML SYRINGE
INTRAMUSCULAR | Status: DC | PRN
  Administered 2023-01-29: 100 mg via INTRAVENOUS

## 2023-01-29 MED ORDER — CHLORHEXIDINE GLUCONATE 0.12 % MT SOLN: 15.0000 mL | Freq: Once | OROMUCOSAL | Status: AC

## 2023-01-29 MED ORDER — DEXAMETHASONE SODIUM PHOSPHATE 10 MG/ML IJ SOLN
INTRAMUSCULAR | Status: DC | PRN
  Administered 2023-01-29: 10 mg via INTRAVENOUS

## 2023-01-29 MED ORDER — BACITRACIN ZINC 500 UNIT/GM EX OINT
TOPICAL_OINTMENT | CUTANEOUS | Status: AC
  Filled 2023-01-29: qty 28.35

## 2023-01-29 MED ORDER — ROCURONIUM BROMIDE 10 MG/ML (PF) SYRINGE
PREFILLED_SYRINGE | INTRAVENOUS | Status: AC
  Filled 2023-01-29: qty 10

## 2023-01-29 MED ORDER — MIDAZOLAM HCL 2 MG/2ML IJ SOLN
INTRAMUSCULAR | Status: DC | PRN
  Administered 2023-01-29: 2 mg via INTRAVENOUS

## 2023-01-29 MED ORDER — MIDAZOLAM HCL 2 MG/2ML IJ SOLN
INTRAMUSCULAR | Status: AC
  Filled 2023-01-29: qty 2

## 2023-01-29 MED ORDER — ROCURONIUM BROMIDE 10 MG/ML (PF) SYRINGE
PREFILLED_SYRINGE | INTRAVENOUS | Status: DC | PRN
  Administered 2023-01-29: 80 mg via INTRAVENOUS

## 2023-01-29 MED ORDER — DROPERIDOL 2.5 MG/ML IJ SOLN: 0.6250 mg | Freq: Once | INTRAMUSCULAR | Status: DC | PRN

## 2023-01-29 MED ORDER — EPHEDRINE SULFATE-NACL 50-0.9 MG/10ML-% IV SOSY
PREFILLED_SYRINGE | INTRAVENOUS | Status: DC | PRN
  Administered 2023-01-29: 10 mg via INTRAVENOUS

## 2023-01-29 MED ORDER — 0.9 % SODIUM CHLORIDE (POUR BTL) OPTIME
TOPICAL | Status: DC | PRN
Start: 2023-01-29 — End: 2023-01-29
  Administered 2023-01-29: 1000 mL

## 2023-01-29 MED ORDER — LIDOCAINE-EPINEPHRINE 1 %-1:100000 IJ SOLN
INTRAMUSCULAR | Status: AC
  Filled 2023-01-29: qty 1

## 2023-01-29 MED ORDER — LACTATED RINGERS IV SOLN: INTRAVENOUS | Status: DC

## 2023-01-29 MED ORDER — OXYCODONE HCL 5 MG/5ML PO SOLN: 5.0000 mg | Freq: Once | ORAL | Status: DC | PRN

## 2023-01-29 MED ORDER — ACETAMINOPHEN 10 MG/ML IV SOLN: 1000.0000 mg | Freq: Once | INTRAVENOUS | Status: DC | PRN

## 2023-01-29 MED ORDER — DEXAMETHASONE SODIUM PHOSPHATE 10 MG/ML IJ SOLN
INTRAMUSCULAR | Status: AC
  Filled 2023-01-29: qty 1

## 2023-01-29 MED ORDER — CHLORHEXIDINE GLUCONATE 0.12 % MT SOLN
OROMUCOSAL | Status: AC
  Administered 2023-01-29: 15 mL via OROMUCOSAL
  Filled 2023-01-29: qty 15

## 2023-01-29 MED ORDER — PROPOFOL 10 MG/ML IV BOLUS
INTRAVENOUS | Status: DC | PRN
  Administered 2023-01-29: 150 mg via INTRAVENOUS

## 2023-01-29 MED ORDER — ONDANSETRON 4 MG PO TBDP: 4.0000 mg | ORAL_TABLET | Freq: Three times a day (TID) | ORAL | 0 refills | Status: DC | PRN

## 2023-01-29 MED ORDER — ONDANSETRON HCL 4 MG/2ML IJ SOLN
INTRAMUSCULAR | Status: DC | PRN
  Administered 2023-01-29: 4 mg via INTRAVENOUS

## 2023-01-29 SURGICAL SUPPLY — 39 items
ADH SKN CLS APL DERMABOND .7 (GAUZE/BANDAGES/DRESSINGS) ×1
BAG COUNTER SPONGE SURGICOUNT (BAG) ×1 IMPLANT
BAG SPNG CNTER NS LX DISP (BAG) ×1
CANISTER SUCT 3000ML PPV (MISCELLANEOUS) ×1 IMPLANT
CLEANER TIP ELECTROSURG 2X2 (MISCELLANEOUS) ×1 IMPLANT
CNTNR URN SCR LID CUP LEK RST (MISCELLANEOUS) ×1 IMPLANT
CONT SPEC 4OZ STRL OR WHT (MISCELLANEOUS) ×1
CORD BIPOLAR FORCEPS 12FT (ELECTRODE) ×1 IMPLANT
COVER SURGICAL LIGHT HANDLE (MISCELLANEOUS) ×1 IMPLANT
DERMABOND ADVANCED .7 DNX12 (GAUZE/BANDAGES/DRESSINGS) ×1 IMPLANT
DRAIN JP 10F RND RADIO (DRAIN) IMPLANT
DRAIN PENROSE 12X.25 LTX STRL (MISCELLANEOUS) ×1 IMPLANT
DRAIN RELI 100 BL SUC LF ST (DRAIN) ×1
DRAPE SURG 17X23 STRL (DRAPES) ×1 IMPLANT
ELECT COATED BLADE 2.86 ST (ELECTRODE) ×1 IMPLANT
ELECT REM PT RETURN 9FT ADLT (ELECTROSURGICAL) ×1
ELECTRODE REM PT RTRN 9FT ADLT (ELECTROSURGICAL) ×1 IMPLANT
EVACUATOR SILICONE 100CC (DRAIN) IMPLANT
FORCEPS BIPOLAR SPETZLER 8 1.0 (NEUROSURGERY SUPPLIES) ×1 IMPLANT
GAUZE 4X4 16PLY ~~LOC~~+RFID DBL (SPONGE) IMPLANT
GLOVE BIO SURGEON STRL SZ7 (GLOVE) IMPLANT
GLOVE ECLIPSE 7.5 STRL STRAW (GLOVE) ×1 IMPLANT
GOWN STRL REUS W/ TWL LRG LVL3 (GOWN DISPOSABLE) ×2 IMPLANT
GOWN STRL REUS W/TWL LRG LVL3 (GOWN DISPOSABLE) ×3
KIT BASIN OR (CUSTOM PROCEDURE TRAY) ×1 IMPLANT
KIT TURNOVER KIT B (KITS) ×1 IMPLANT
NDL PRECISIONGLIDE 27X1.5 (NEEDLE) ×1 IMPLANT
NEEDLE PRECISIONGLIDE 27X1.5 (NEEDLE) ×1
NS IRRIG 1000ML POUR BTL (IV SOLUTION) ×1 IMPLANT
PAD ARMBOARD 7.5X6 YLW CONV (MISCELLANEOUS) ×2 IMPLANT
PENCIL FOOT CONTROL (ELECTRODE) ×1 IMPLANT
SPECIMEN JAR SMALL (MISCELLANEOUS) ×1 IMPLANT
STAPLER VISISTAT 35W (STAPLE) ×1 IMPLANT
SUT CHROMIC 3 0 PS 2 (SUTURE) IMPLANT
SUT ETHILON 3 0 PS 1 (SUTURE) IMPLANT
SUT SILK 3 0 REEL (SUTURE) ×1 IMPLANT
SUT SILK 4 0 REEL (SUTURE) ×1 IMPLANT
TRAY ENT MC OR (CUSTOM PROCEDURE TRAY) ×1 IMPLANT
WATER STERILE IRR 1000ML POUR (IV SOLUTION) ×1 IMPLANT

## 2023-01-29 NOTE — Discharge Instructions (Signed)
Keep everything clean and dry.  Avoid strenuous activity for 2 weeks.  Empty the drain, recharge 3 times daily.  Record the amount that comes out.  Dr. Pollyann Kennedy will contact you on Sunday to set up a time to come to the office on Monday to have the drain removed.

## 2023-01-29 NOTE — Transfer of Care (Signed)
Immediate Anesthesia Transfer of Care Note  Patient: Collin [name]  Procedure(s) Performed: EXCISION OF SUBMANDIBULAR GLAND (Right: Neck)  Patient Location: PACU  Anesthesia Type:General  Level of Consciousness: awake, alert , and patient cooperative  Airway & Oxygen Therapy: Patient Spontanous Breathing  Post-op Assessment: Report given to RN and Post -op Vital signs reviewed and stable  Post vital signs: Reviewed and stable  Last Vitals:  Vitals Value Taken Time  BP 169/86 01/29/23 1633  Temp    Pulse 69 01/29/23 1637  Resp 23 01/29/23 1637  SpO2 94 % 01/29/23 1637  Vitals shown include unfiled device data.  Last Pain:  Vitals:   01/29/23 1338  TempSrc:   PainSc: 0-No pain         Complications: No notable events documented.

## 2023-01-29 NOTE — Interval H&P Note (Signed)
History and Physical Interval Note:  01/29/2023 3:03 PM  Collin [name]  has presented today for surgery, with the diagnosis of Neck mass; Sialolithiasis of submandibular gland.  The various methods of treatment have been discussed with the patient and family. After consideration of risks, benefits and other options for treatment, the patient has consented to  Procedure(s): EXCISION OF SUBMANDIBULAR GLAND (Right) as a surgical intervention.  The patient's history has been reviewed, patient examined, no change in status, stable for surgery.  I have reviewed the patient's chart and labs.  Questions were answered to the patient's satisfaction.     Serena Colonel

## 2023-01-29 NOTE — Anesthesia Procedure Notes (Signed)
Procedure Name: Intubation Date/Time: 01/29/2023 3:20 PM  Performed by: Orlin Hilding, CRNAPre-anesthesia Checklist: Patient identified, Emergency Drugs available, Suction available, Patient being monitored and Timeout performed Patient Re-evaluated:Patient Re-evaluated prior to induction Oxygen Delivery Method: Circle system utilized Preoxygenation: Pre-oxygenation with 100% oxygen Induction Type: IV induction Ventilation: Mask ventilation without difficulty and Oral airway inserted - appropriate to patient size Grade View: Grade I Tube type: Oral Tube size: 7.5 mm Number of attempts: 1 Placement Confirmation: ETT inserted through vocal cords under direct vision, positive ETCO2 and breath sounds checked- equal and bilateral Secured at: 21 cm Tube secured with: Tape Dental Injury: Teeth and Oropharynx as per pre-operative assessment

## 2023-01-29 NOTE — Anesthesia Preprocedure Evaluation (Signed)
Anesthesia Evaluation  Patient identified by MRN, date of birth, ID band Patient awake    Reviewed: Allergy & Precautions, H&P , NPO status , Patient's Chart, lab work & pertinent test results  Airway Mallampati: II  TM Distance: >3 FB Neck ROM: Full    Dental no notable dental hx.    Pulmonary COPD   Pulmonary exam normal breath sounds clear to auscultation       Cardiovascular hypertension, Normal cardiovascular exam Rhythm:Regular Rate:Normal     Neuro/Psych negative neurological ROS  negative psych ROS   GI/Hepatic negative GI ROS, Neg liver ROS,,,  Endo/Other  negative endocrine ROS    Renal/GU negative Renal ROS  negative genitourinary   Musculoskeletal  (+) Arthritis ,    Abdominal   Peds negative pediatric ROS (+)  Hematology negative hematology ROS (+)   Anesthesia Other Findings   Reproductive/Obstetrics negative OB ROS                             Anesthesia Physical Anesthesia Plan  ASA: 3  Anesthesia Plan: General   Post-op Pain Management:    Induction: Intravenous  PONV Risk Score and Plan: Ondansetron and Dexamethasone  Airway Management Planned: Oral ETT  Additional Equipment:   Intra-op Plan:   Post-operative Plan: Extubation in OR  Informed Consent: I have reviewed the patients History and Physical, chart, labs and discussed the procedure including the risks, benefits and alternatives for the proposed anesthesia with the patient or authorized representative who has indicated his/her understanding and acceptance.     Dental advisory given  Plan Discussed with: CRNA  Anesthesia Plan Comments:        Anesthesia Quick Evaluation

## 2023-01-29 NOTE — Op Note (Signed)
OPERATIVE REPORT  DATE OF SURGERY: 01/29/2023  PATIENT:  Collin [name],  62 y.o. male  PRE-OPERATIVE DIAGNOSIS:  Neck mass; Sialolithiasis of submandibular gland, right  POST-OPERATIVE DIAGNOSIS:  Neck mass; Sialolithiasis of submandibular gland, right  PROCEDURE:  Procedure(s): EXCISION OF SUBMANDIBULAR GLAND, right  SURGEON:  Susy Frizzle, MD  ASSISTANTS: RNFA  ANESTHESIA:   General   EBL: 40 ml  DRAINS: 7 French round JP  LOCAL MEDICATIONS USED:  None  SPECIMEN: Right submandibular gland with associated lymph nodes  COUNTS:  Correct  PROCEDURE DETAILS: The patient was taken to the operating room and placed on the operating table in the supine position. Following induction of general endotracheal anesthesia, the right neck was prepped and draped in standard fashion.  The submandibular gland was firm and enlarged.  A transverse incision was outlined with a marking pen about 2 fingerbreadths below the mandible.  Electrocautery was used to incise the skin and subcutaneous tissue.  The platysma was divided.  A subplatysmal flap was developed superiorly.  The fibrofatty tissue containing the mandibular branches of the facial nerve was dissected up superiorly off of the gland.  The gland was then dissected off of the mandible.  The facial vasculature was separately identified ligated between clamps and divided.  3-0 silk ties were used throughout.  The mylohyoid was identified and reflected up superiorly.  There is diffuse fibrosis around the soft tissue surrounding the gland.  The submandibular ganglion was exposed and divided.  The duct was then divided as well.  The gland was brought down away from the digastric muscle.  The hypoglossal nerve was identified and preserved.  The digastric muscle was preserved.  The lingual artery was ligated between clamps and divided.  The gland was then delivered.  There were multiple lymph nodes surrounding the gland that were taken with the gland.  The  wound was irrigated with saline.  Hemostasis was completed.  Bipolar cautery was used as needed.  The gland was exited through a separate stab incision and secured in place with a nylon suture.  The platysma layer was reapproximated with interrupted 3-0 chromic.  A subcuticular running 3-0 chromic closure was accomplished and Dermabond was used on the skin.  Patient was awakened extubated and transferred to recovery in stable condition.    PATIENT DISPOSITION:  To PACU, stable

## 2023-01-30 NOTE — Anesthesia Postprocedure Evaluation (Signed)
Anesthesia Post Note  Patient: Collin [name]  Procedure(s) Performed: EXCISION OF SUBMANDIBULAR GLAND (Right: Neck)     Patient location during evaluation: PACU Anesthesia Type: General Level of consciousness: awake and alert Pain management: pain level controlled Vital Signs Assessment: post-procedure vital signs reviewed and stable Respiratory status: spontaneous breathing, nonlabored ventilation, respiratory function stable and patient connected to nasal cannula oxygen Cardiovascular status: blood pressure returned to baseline and stable Postop Assessment: no apparent nausea or vomiting Anesthetic complications: no   No notable events documented.  Last Vitals:  Vitals:   01/29/23 1700 01/29/23 1715  BP: (!) 172/85 (!) 170/85  Pulse: 63 (!) 58  Resp: 16 12  Temp:  36.6 C  SpO2: 94% 95%    Last Pain:  Vitals:   01/29/23 1715  TempSrc:   PainSc: 0-No pain                 [place]

## 2023-01-31 ENCOUNTER — Encounter (HOSPITAL_COMMUNITY): Payer: Self-pay | Admitting: Otolaryngology

## 2023-02-04 LAB — SURGICAL PATHOLOGY

## 2023-02-22 ENCOUNTER — Other Ambulatory Visit: Payer: Self-pay | Admitting: Pulmonary Disease

## 2023-02-22 DIAGNOSIS — J4520 Mild intermittent asthma, uncomplicated: Secondary | ICD-10-CM

## 2023-03-05 DIAGNOSIS — M19011 Primary osteoarthritis, right shoulder: Secondary | ICD-10-CM | POA: Diagnosis not present

## 2023-03-05 DIAGNOSIS — M25512 Pain in left shoulder: Secondary | ICD-10-CM | POA: Diagnosis not present

## 2023-03-18 DIAGNOSIS — K115 Sialolithiasis: Secondary | ICD-10-CM | POA: Diagnosis not present

## 2023-04-02 ENCOUNTER — Ambulatory Visit: Payer: Self-pay | Admitting: General Surgery

## 2023-04-02 DIAGNOSIS — K4090 Unilateral inguinal hernia, without obstruction or gangrene, not specified as recurrent: Secondary | ICD-10-CM | POA: Diagnosis not present

## 2023-04-02 NOTE — H&P (Signed)
Chief Complaint: New Consultation       History of Present Illness: Collin [name] is a 62 y.o. male who is seen today as an office consultation at the request of Dr. Lenise Arena for evaluation of New Consultation .   Patient is a 62 year old male who comes in secondary to a right inguinal hernia.  He states this been there for several months.  He states that it is fairly large.  He does do some heavy lifting and feels this interferes.  He had no signs or symptoms of incarceration or strangulation.   Patient's had no previous abdominal surgery.         Review of Systems: A complete review of systems was obtained from the patient.  I have reviewed this information and discussed as appropriate with the patient.  See HPI as well for other ROS.   Review of Systems  Constitutional:  Negative for fever.  HENT:  Negative for congestion.   Eyes:  Negative for blurred vision.  Respiratory:  Negative for cough, shortness of breath and wheezing.   Cardiovascular:  Negative for chest pain and palpitations.  Gastrointestinal:  Negative for heartburn.  Genitourinary:  Negative for dysuria.  Musculoskeletal:  Negative for myalgias.  Skin:  Negative for rash.  Neurological:  Negative for dizziness and headaches.  Psychiatric/Behavioral:  Negative for depression and suicidal ideas.   All other systems reviewed and are negative.       Medical History: Past Medical History History reviewed. No pertinent past medical history.     Problem List There is no problem list on file for this patient.     Past Surgical History Past Surgical History: Procedure Laterality Date  JOINT REPLACEMENT Bilateral     shoulder  Neck surgery          Allergies No Known Allergies    Medications Ordered Prior to Encounter Current Outpatient Medications on File Prior to Visit Medication Sig Dispense Refill  aspirin 81 MG EC tablet Take 81 mg by mouth      cholecalciferol (VITAMIN D3) 2,000 unit capsule Take  2,000 Units by mouth once daily      fenofibrate micronized (LOFIBRA) 134 MG capsule Take by mouth      fluticasone propion-salmeteroL (ADVAIR HFA) 115-21 mcg/actuation inhaler Inhale 2 inhalations into the lungs 2 (two) times daily      losartan (COZAAR) 50 MG tablet Take 50 mg by mouth once daily        No current facility-administered medications on file prior to visit.      Family History Family History Problem Relation Age of Onset  High blood pressure (Hypertension) Mother    Hyperlipidemia (Elevated cholesterol) Mother    Breast cancer Sister        Tobacco Use History Social History    Tobacco Use Smoking Status Never Smokeless Tobacco Never      Social History Social History    Socioeconomic History  Marital status: Married Tobacco Use  Smoking status: Never  Smokeless tobacco: Never Substance and Sexual Activity  Alcohol use: Yes  Drug use: Never    Social Drivers of Health    Food Insecurity: Low Risk  (11/09/2022)   Received from [facility]   Hunger Vital Sign    Worried About Running Out of Food in the Last Year: Never true    Ran Out of Food in the Last Year: Never true Transportation Needs: No Transportation Needs (11/09/2022)   Received from [employer]  In the past 12 months, has lack of reliable transportation kept you from medical appointments, meetings, work or from getting things needed for daily living? : No   Received from [employer]   Social Network Housing Stability: Low Risk  (11/09/2022)   Received from [facility] Stability Vital Sign    What is your living situation today?: I have a steady place to live      Objective:     Vitals:   04/02/23 0948 BP: (!) 140/80 Pulse: 64 Temp: 37.1 C (98.8 F) SpO2: 99% Weight: (!) 120.1 kg (264 lb 12.8 oz) Height: 198.1 cm (6\' 6" ) PainSc: 0-No pain PainLoc: Abdomen   Body mass index is 30.6 kg/m. Physical Exam Constitutional:       Appearance: Normal appearance.  HENT:     Head: Normocephalic and atraumatic.     Nose: Nose normal. No congestion.     Mouth/Throat:     Mouth: Mucous membranes are moist.     Pharynx: Oropharynx is clear.  Eyes:     Pupils: Pupils are equal, round, and reactive to light.  Cardiovascular:     Rate and Rhythm: Normal rate and regular rhythm.     Pulses: Normal pulses.     Heart sounds: Normal heart sounds. No murmur heard.    No friction rub. No gallop.  Pulmonary:     Effort: Pulmonary effort is normal. No respiratory distress.     Breath sounds: Normal breath sounds. No stridor. No wheezing, rhonchi or rales.  Abdominal:     General: Abdomen is flat.     Hernia: A hernia is present. Hernia is present in the left inguinal area and right inguinal area.  Musculoskeletal:        General: Normal range of motion.     Cervical back: Normal range of motion.  Skin:    General: Skin is warm and dry.  Neurological:     General: No focal deficit present.     Mental Status: He is alert and oriented to person, place, and time.  Psychiatric:        Mood and Affect: Mood normal.        Thought Content: Thought content normal.          Assessment and Plan: Diagnoses and all orders for this visit:   Unilateral inguinal hernia without obstruction or gangrene, recurrence not specified     Collin [name] is a 62 y.o. male    1.  We will proceed to the OR for a lap scopic right inguinal hernia repair with mesh. 2. All risks and benefits were discussed with the patient, to generally include infection, bleeding, damage to surrounding structures, acute and chronic nerve pain, and recurrence. Alternatives were offered and described.  All questions were answered and the patient voiced understanding of the procedure and wishes to proceed at this point.             No follow-ups on file.   Axel Filler, MD, [facility], [place] [facility]

## 2023-05-10 DIAGNOSIS — K4090 Unilateral inguinal hernia, without obstruction or gangrene, not specified as recurrent: Secondary | ICD-10-CM | POA: Diagnosis not present

## 2023-05-17 DIAGNOSIS — L718 Other rosacea: Secondary | ICD-10-CM | POA: Diagnosis not present

## 2023-06-16 DIAGNOSIS — Z125 Encounter for screening for malignant neoplasm of prostate: Secondary | ICD-10-CM | POA: Diagnosis not present

## 2023-06-16 DIAGNOSIS — Z0000 Encounter for general adult medical examination without abnormal findings: Secondary | ICD-10-CM | POA: Diagnosis not present

## 2023-06-16 DIAGNOSIS — N1831 Chronic kidney disease, stage 3a: Secondary | ICD-10-CM | POA: Diagnosis not present

## 2023-06-16 DIAGNOSIS — E782 Mixed hyperlipidemia: Secondary | ICD-10-CM | POA: Diagnosis not present

## 2023-06-16 DIAGNOSIS — I10 Essential (primary) hypertension: Secondary | ICD-10-CM | POA: Diagnosis not present

## 2023-08-28 IMAGING — MR MR SHOULDER*L* W/O CM
4 of 5 series · 21 of 40 positions shown · non-contrast
Comparison: None.

CLINICAL DATA: Chronic left shoulder pain with limited range of
motion x 4 months.

EXAM:
MRI OF THE LEFT SHOULDER WITHOUT CONTRAST
TECHNIQUE: Multiplanar, multisequence MR imaging of the shoulder was performed.
No intravenous contrast was administered.

[Series 6: T2 fat-sat · axial · left · 3.0mm · 0.47mm/px · z∈[-75,+19]mm · 8 of 27 slices shown (1 of 3)]
[im 1/27]
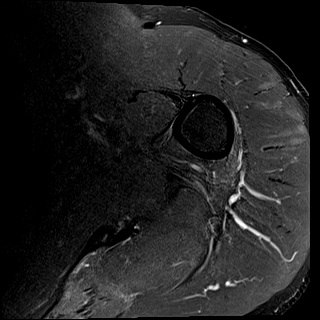
[im 3/27]
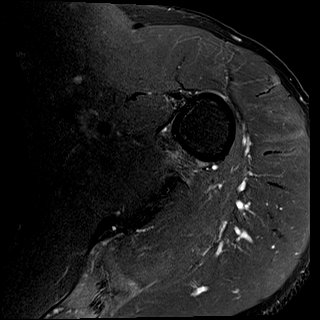
[im 9/27]
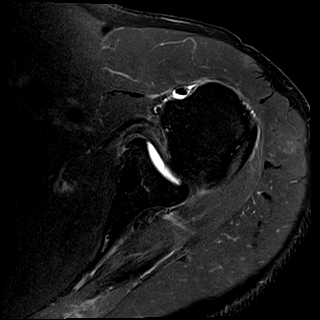
[im 12/27]
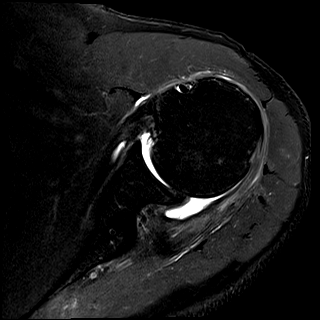
[im 15/27]
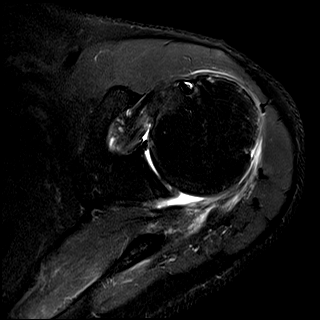
[im 18/27]
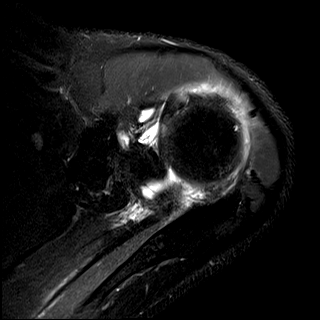
[im 24/27]
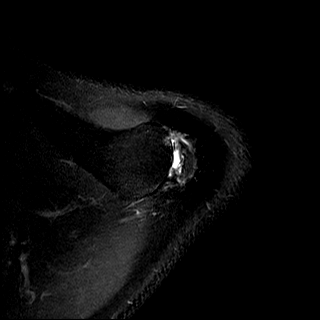
[im 27/27]
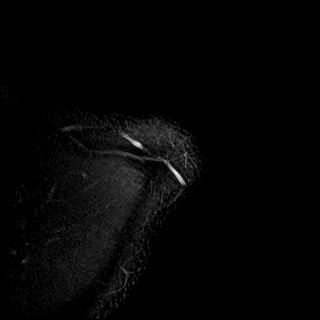

[Series 9: T2 fat-sat · oblique · left · 4.0mm · 0.44mm/px · 3 of 23 slices shown (2 of 3)]
[im 4/23]
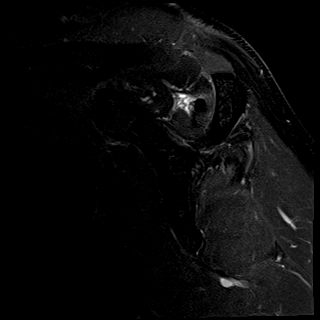
[im 13/23]
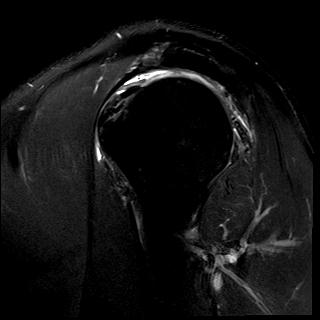
[im 19/23]
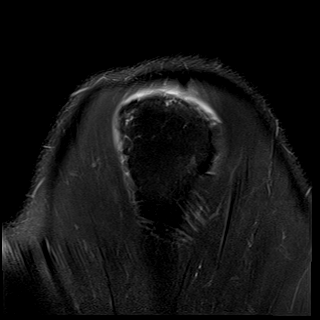

[Series 100: T2 fat-sat · oblique · left · 4.0mm · 0.22mm/px · 3 of 21 slices shown (3 of 3)]
[im 4/21]
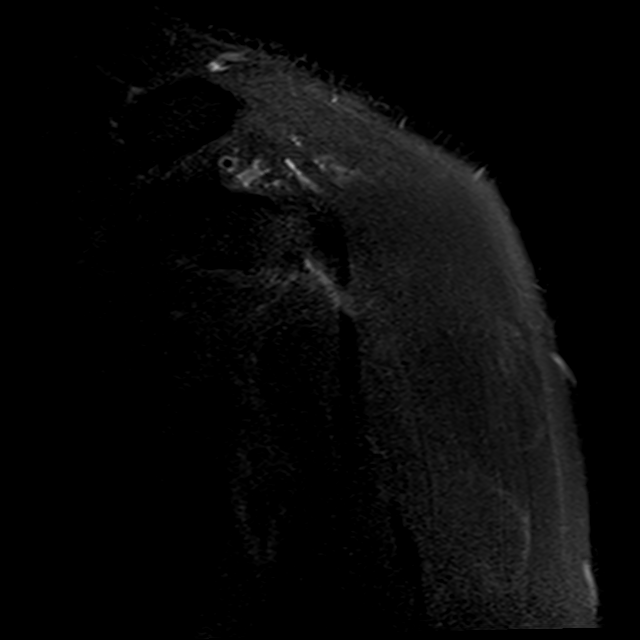
[im 11/21]
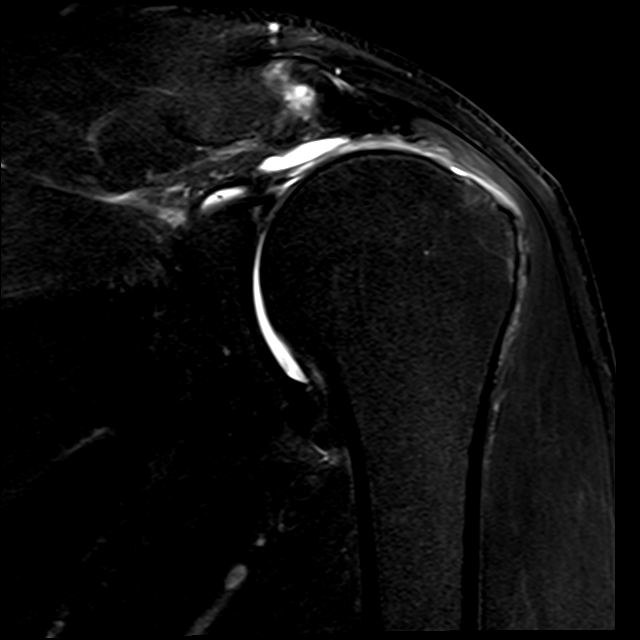
[im 17/21]
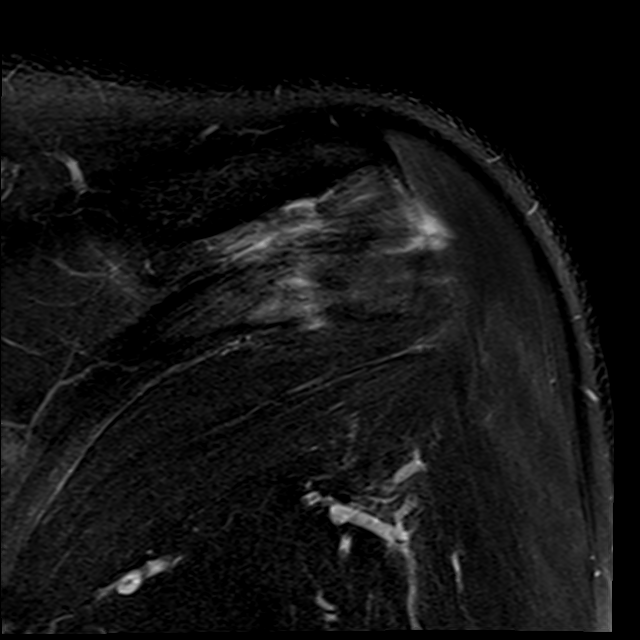

[Series 101: PD · oblique · left · 4.0mm · 0.22mm/px · 7 of 21 slices shown]
[im 1/21]
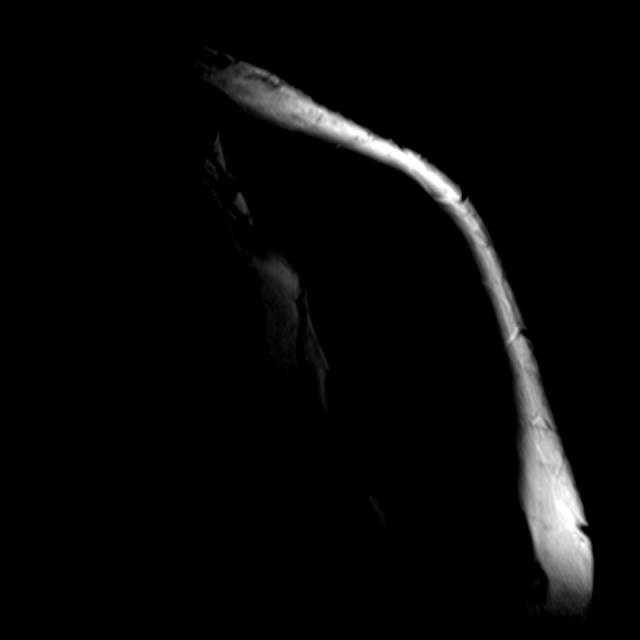
[im 4/21]
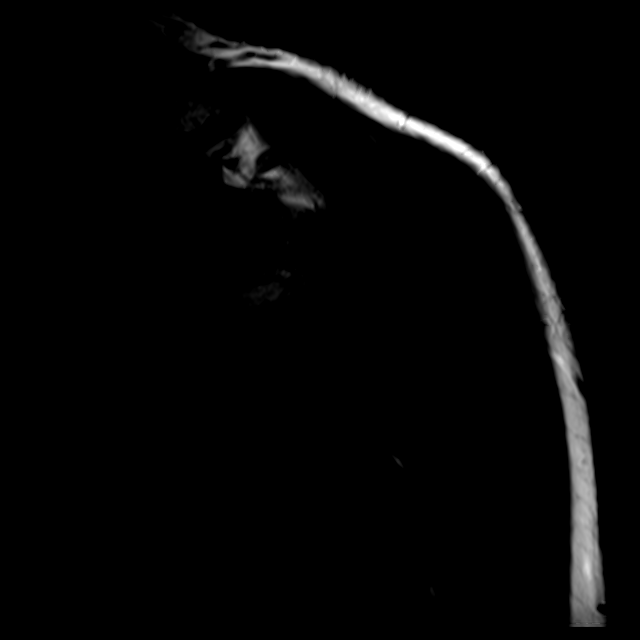
[im 7/21]
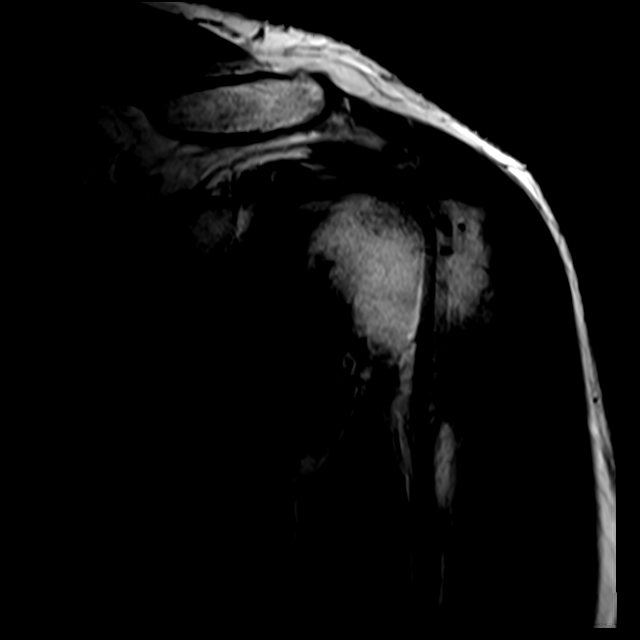
[im 11/21]
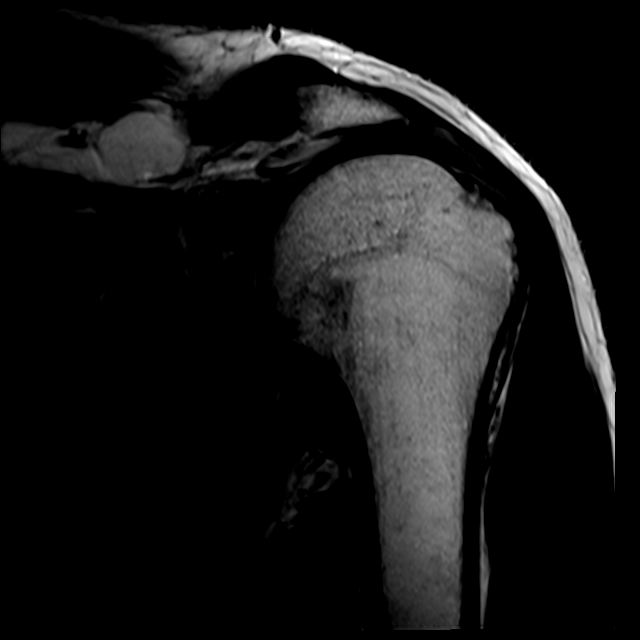
[im 14/21]
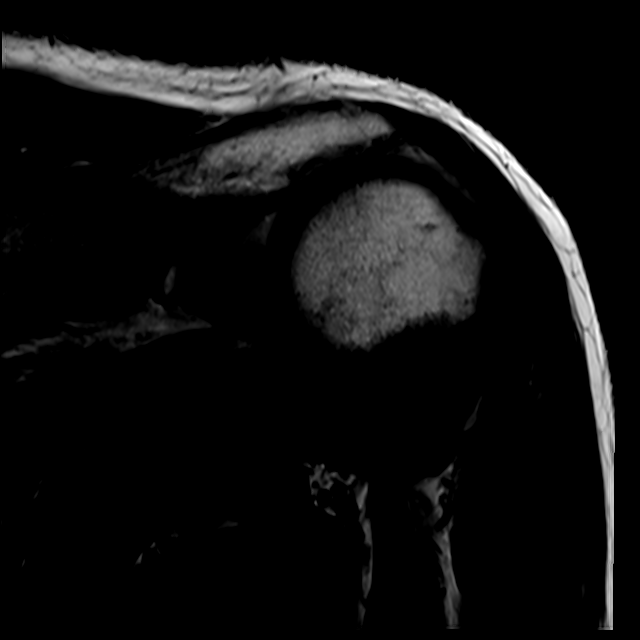
[im 17/21]
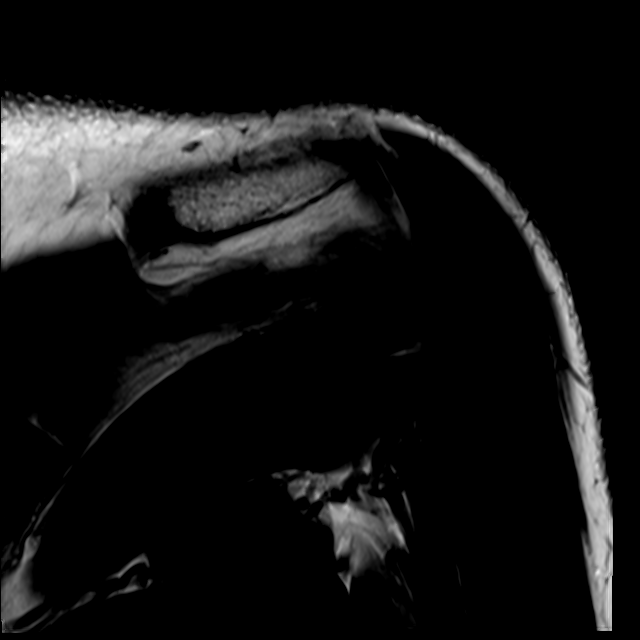
[im 21/21]
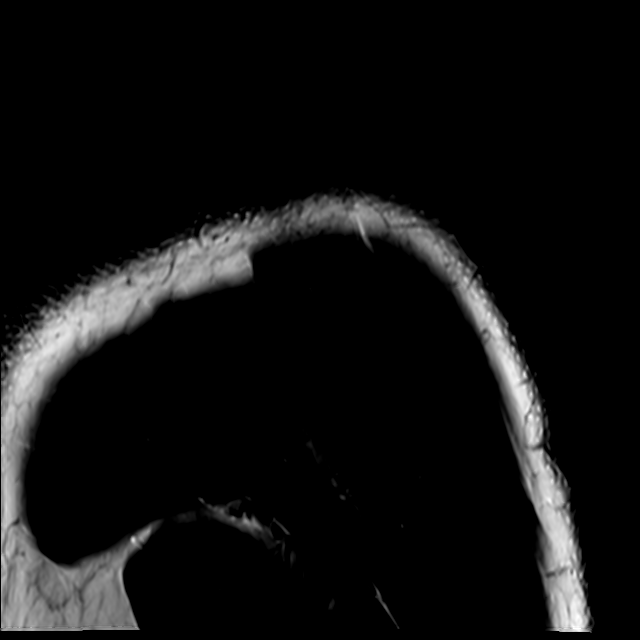

[21 of 40 positions shown; findings below may reference images not displayed]

FINDINGS: Rotator cuff: Full-thickness, full width tear of the supraspinatus
tendon with over 5 cm retraction to the glenoid. Full-thickness,
near full width tear of the infraspinatus tendon 4.5 cm retraction
to the medial humeral head. Severe subscapularis tendinosis with
small intrasubstance tear of the mid tendon. The teres minor tendon
is unremarkable.

Muscles:  Mild supraspinatus and infraspinatus muscle atrophy.

Biceps long head:  Intact and normally positioned.

Acromioclavicular Joint: Mild arthropathy of the acromioclavicular
joint. Type II acromion. Small amount of fluid in the
subacromial/subdeltoid bursa.

Glenohumeral Joint: Small joint effusion. Diffuse cartilage thinning
without focal defect.

Labrum: Small superior labral tear (series 100, image 11). Otherwise
intact.

Bones: No acute fracture or dislocation. High-riding humeral head.
No suspicious bone lesion.

Other: None.
IMPRESSION: 1. Full-thickness, full width tear of the supraspinatus tendon with
retraction and mild muscle atrophy.
2. Full-thickness, near full width tear of the infraspinatus tendon
with retraction and mild muscle atrophy.
3. Severe subscapularis tendinosis with small intrasubstance tear of
the mid tendon.
4. Small superior labral tear.
5. Mild acromioclavicular and glenohumeral osteoarthritis.

## 2023-11-15 ENCOUNTER — Encounter: Payer: Self-pay | Admitting: Pulmonary Disease

## 2023-11-15 ENCOUNTER — Ambulatory Visit (INDEPENDENT_AMBULATORY_CARE_PROVIDER_SITE_OTHER): Payer: BC Managed Care – PPO | Admitting: Pulmonary Disease

## 2023-11-15 DIAGNOSIS — J4520 Mild intermittent asthma, uncomplicated: Secondary | ICD-10-CM

## 2023-11-15 DIAGNOSIS — J449 Chronic obstructive pulmonary disease, unspecified: Secondary | ICD-10-CM | POA: Diagnosis not present

## 2023-11-15 MED ORDER — ALBUTEROL SULFATE HFA 108 (90 BASE) MCG/ACT IN AERS: 2.0000 | INHALATION_SPRAY | Freq: Four times a day (QID) | RESPIRATORY_TRACT | 6 refills | Status: AC | PRN

## 2023-11-15 MED ORDER — FLUTICASONE-SALMETEROL 115-21 MCG/ACT IN AERO: 2.0000 | INHALATION_SPRAY | Freq: Two times a day (BID) | RESPIRATORY_TRACT | 3 refills | Status: DC

## 2023-11-15 NOTE — Patient Instructions (Signed)
 Continue advair inhaler 2 puffs twice daily - rinse mouth out after each use  Use albuterol  inhaler 1-2 puffs every 4-6 hours as needed - take one inhaler to work with you  Follow up in 1 year, call sooner if needed

## 2023-11-15 NOTE — Progress Notes (Signed)
 Synopsis: Referred in March 2024 for cough by Ruven Coy, MD  Subjective:   PATIENT ID: Collin [name] GENDER: male DOB: 1960-12-05, MRN: [id number]  HPI  Chief Complaint  Patient presents with   Follow-up    Pt states doing well no concerns.   Collin [name] is a 63 year old male, never smoker with history of hypertension who returns to pulmonary clinic for chronic cough.   Initial OV 09/01/22 The cough has been present for a couple of years. He has mucous production that is clear/whitish and some wheezing after coughing episode. The cough occurs randomonly. He was coughing October through February. The cough has been better over the last month. Cold airs bother his cough. Denies heartburn or reflux. He has not tried an inhalers in the past.   He is a never smoker. He had second hand smoke exposure in childhood. 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 He has been using automotive paints and using polyurethane foams, saw dust, etc. He has a dog. Lives with his wife. No family history of lung disease. Sister with breast cancer.  OV 09/01/22 He was started on advair HFA 115-21mcg 2 puffs twice daily at last visit. He reports his cough has improved and some improvement of the dyspnea. He remains active going on motorcycle trips.  PFTs show mild obstruction with air trapping   OV 11/15/23 He experiences occasional cough attacks every couple of weeks, lasting part of a day, often occurring at work. He suspects exposure to irritants such as 'pain, fumar' as a trigger.  He was previously on Trelegy, which was ineffective. Currently, he uses Advair, two puffs in the morning, and has used his albuterol  inhaler only once or twice, preferring to keep it at home.  He has no shortness of breath with activities and maintains his normal activity level. He takes simple walks and recently returned from a trip to [place] , with plans for motorcycle trips in the coming  months.  Past Medical History:  Diagnosis Date   COPD (chronic obstructive pulmonary disease) (HCC)    Related to work environment. Not a smoker   Hypertension      History reviewed. No pertinent family history.   Social History   Socioeconomic History   Marital status: Married    Spouse name: Not on file   Number of children: Not on file   Years of education: Not on file   Highest education level: Not on file  Occupational History   Not on file  Tobacco Use   Smoking status: Never    Passive exposure: Past   Smokeless tobacco: Never  Vaping Use   Vaping status: Never Used  Substance and Sexual Activity   Alcohol use: Yes    Comment: occasionally   Drug use: Never   Sexual activity: Yes  Other Topics Concern   Not on file  Social History Narrative   Married   Social Drivers of Corporate investment banker Strain: Not on file  Food Insecurity: Low Risk  (11/09/2022)   Received from [facility]   Hunger Vital Sign    Within the past 12 months, you worried that your food would run out before you got money to buy more: Never true    Within the past 12 months, the food you bought just didn't last and you didn't have money to get more. : Never true  Transportation Needs: No Transportation Needs (11/09/2022)   Received  from [facility]    In the past 12 months, has lack of reliable transportation kept you from medical appointments, meetings, work or from getting things needed for daily living? : No  Physical Activity: Not on file  Stress: Not on file  Social Connections: Unknown (10/13/2021)   Received from [facility]   Social Network    Social Network: Not on file  Intimate Partner Violence: Unknown (09/04/2021)   Received from [facility]   HITS    Physically Hurt: Not on file    Insult or Talk Down To: Not on file    Threaten Physical Harm: Not on file    Scream or Curse: Not on file     No Known Allergies   Outpatient Medications  Prior to Visit  Medication Sig Dispense Refill   aspirin EC 81 MG tablet Take 81 mg by mouth daily. Swallow whole.     calcium carbonate (SUPER CALCIUM) 1500 (600 Ca) MG TABS tablet Take 600 mg of elemental calcium by mouth daily.     Cholecalciferol 50 MCG (2000 UT) CAPS Take 2,000 Units by mouth daily.     fenofibrate micronized (LOFIBRA) 134 MG capsule Take 134 mg by mouth daily.     losartan (COZAAR) 50 MG tablet Take 50 mg by mouth daily.     metroNIDAZOLE (METROGEL) 0.75 % gel Apply 1 Application topically in the morning.     vitamin E 180 MG (400 UNITS) capsule Take 400 Units by mouth daily.     albuterol  (VENTOLIN  HFA) 108 (90 Base) MCG/ACT inhaler Inhale 2 puffs into the lungs every 6 (six) hours as needed for wheezing or shortness of breath. 8 g 6   fluticasone -salmeterol (ADVAIR HFA) 115-21 MCG/ACT inhaler INHALE 2 PUFFS INTO THE LUNGS TWICE A DAY 36 g 0   Fluticasone -Umeclidin-Vilant (TRELEGY ELLIPTA ) 100-62.5-25 MCG/ACT AEPB Inhale 1 puff into the lungs daily. (Patient not taking: Reported on 01/12/2023) 1 each 0   HYDROcodone -acetaminophen  (NORCO) 7.5-325 MG tablet Take 1 tablet by mouth every 6 (six) hours as needed for moderate pain. 20 tablet 0   ondansetron  (ZOFRAN -ODT) 4 MG disintegrating tablet Take 1 tablet (4 mg total) by mouth every 8 (eight) hours as needed for nausea or vomiting. 20 tablet 0   No facility-administered medications prior to visit.    Review of Systems  Constitutional:  Negative for chills, fever, malaise/fatigue and weight loss.  HENT:  Negative for congestion, sinus pain and sore throat.   Eyes: Negative.   Respiratory:  Positive for cough. Negative for hemoptysis, sputum production, shortness of breath and wheezing.   Cardiovascular:  Negative for chest pain, palpitations, orthopnea, claudication and leg swelling.  Gastrointestinal:  Negative for abdominal pain, heartburn, nausea and vomiting.  Genitourinary: Negative.   Skin:  Negative for rash.   Endo/Heme/Allergies: Negative.   Psychiatric/Behavioral: Negative.     Objective:   Vitals:   11/15/23 0832  BP: (!) 153/81  Pulse: (!) 54  SpO2: 95%  Weight: 261 lb (118.4 kg)  Height: 6' 7 (2.007 m)    Physical Exam Constitutional:      General: He is not in acute distress. HENT:     Head: Normocephalic and atraumatic.   Cardiovascular:     Rate and Rhythm: Normal rate and regular rhythm.     Pulses: Normal pulses.     Heart sounds: Normal heart sounds. No murmur heard. Pulmonary:     Effort: Pulmonary effort is normal.  Breath sounds: Normal breath sounds. No wheezing, rhonchi or rales.   Musculoskeletal:     Right lower leg: No edema.     Left lower leg: No edema.   Neurological:     Mental Status: He is alert.     CBC    Component Value Date/Time   WBC 7.3 01/21/2023 0849   RBC 4.72 01/21/2023 0849   HGB 15.0 01/21/2023 0849   HCT 45.7 01/21/2023 0849   PLT 232 01/21/2023 0849   MCV 96.8 01/21/2023 0849   MCH 31.8 01/21/2023 0849   MCHC 32.8 01/21/2023 0849   RDW 12.9 01/21/2023 0849     Chest imaging: CT Chest 07/30/22 Cardiovascular: The heart is normal in size and there is no pericardial effusion. The aorta and pulmonary trunk are normal in caliber.   Mediastinum/Nodes: No mediastinal or axillary lymphadenopathy. Evaluation of the hila is limited due to lack of IV contrast. The thyroid gland, trachea, and esophagus are within normal limits.   Lungs/Pleura: Minimal atelectasis or scarring is noted bilaterally. No effusion or pneumothorax. No significant pulmonary nodule or mass.  CXR 06/10/22 The heart size and mediastinal contours are within normal limits. Pulmonary hyperinflation is consistent with COPD. No evidence of pulmonary infiltrate or edema. No evidence of pleural effusion. 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  PFT:    Latest Ref Rng & Units 11/03/2022    1:42 PM  PFT Results  FVC-Pre L 6.08   FVC-Predicted Pre % 96    FVC-Post L 6.32   FVC-Predicted Post % 99   Pre FEV1/FVC % % 66   Post FEV1/FCV % % 69   FEV1-Pre L 4.01   FEV1-Predicted Pre % 84   FEV1-Post L 4.39   DLCO uncorrected ml/min/mmHg 35.57   DLCO UNC% % 100   DLCO corrected ml/min/mmHg 35.57   DLCO COR %Predicted % 100   DLVA Predicted % 85   TLC L 10.61   TLC % Predicted % 120   RV % Predicted % 146     Labs 06/2022: Cr 1.2 CO2 28  Path:  Echo:  Heart Catheterization:  Assessment & Plan:   Chronic obstructive pulmonary disease, unspecified COPD type (HCC) - Plan: albuterol  (VENTOLIN  HFA) 108 (90 Base) MCG/ACT inhaler  Mild intermittent asthmatic bronchitis without complication - Plan: fluticasone -salmeterol (ADVAIR HFA) 115-21 MCG/ACT inhaler  Discussion: Collin [name] is a 63 year old male, never smoker with history of hypertension who returns to pulmonary clinic for COPD.   COPD - Continue advair 115-21mcg 2 puffs twice daily - Use albuterol  inhaler 1-2 puffs every 4-6 hours as needed - Refills sent to pharmacy - Follow up in 1 year  Duaine German, MD [facility] Office: [phone]   Current Outpatient Medications:    albuterol  (VENTOLIN  HFA) 108 (90 Base) MCG/ACT inhaler, Inhale 2 puffs into the lungs every 6 (six) hours as needed for wheezing or shortness of breath., Disp: 8 g, Rfl: 6   aspirin EC 81 MG tablet, Take 81 mg by mouth daily. Swallow whole., Disp: , Rfl:    calcium carbonate (SUPER CALCIUM) 1500 (600 Ca) MG TABS tablet, Take 600 mg of elemental calcium by mouth daily., Disp: , Rfl:    Cholecalciferol 50 MCG (2000 UT) CAPS, Take 2,000 Units by mouth daily., Disp: , Rfl:    fenofibrate micronized (LOFIBRA) 134 MG capsule, Take 134 mg by mouth daily., Disp: , Rfl:    fluticasone -salmeterol (ADVAIR HFA) 115-21 MCG/ACT inhaler, Inhale 2 puffs into  the lungs 2 (two) times daily., Disp: 24 g, Rfl: 3   losartan (COZAAR) 50 MG tablet, Take 50 mg by mouth daily., Disp: , Rfl:     metroNIDAZOLE (METROGEL) 0.75 % gel, Apply 1 Application topically in the morning., Disp: , Rfl:    vitamin E 180 MG (400 UNITS) capsule, Take 400 Units by mouth daily., Disp: , Rfl:

## 2024-01-19 DIAGNOSIS — R2990 Unspecified symptoms and signs involving the nervous system: Secondary | ICD-10-CM | POA: Diagnosis not present

## 2024-01-19 DIAGNOSIS — Z6829 Body mass index (BMI) 29.0-29.9, adult: Secondary | ICD-10-CM | POA: Diagnosis not present

## 2024-02-18 ENCOUNTER — Ambulatory Visit (INDEPENDENT_AMBULATORY_CARE_PROVIDER_SITE_OTHER): Admitting: Neurology

## 2024-02-18 ENCOUNTER — Encounter: Payer: Self-pay | Admitting: Neurology

## 2024-02-18 VITALS — BP 159/84 | HR 56 | Ht 79.0 in | Wt 259.5 lb

## 2024-02-18 DIAGNOSIS — R531 Weakness: Secondary | ICD-10-CM

## 2024-02-18 DIAGNOSIS — R001 Bradycardia, unspecified: Secondary | ICD-10-CM

## 2024-02-18 NOTE — Progress Notes (Signed)
 [facility]  PATIENT: Collin [name] DOB: 01-Jul-1960  REQUESTING CLINICIAN: Nanci Senior, MD HISTORY FROM: Patient  REASON FOR VISIT: Recurrent left sided weakness    HISTORICAL  CHIEF COMPLAINT:  Chief Complaint  Patient presents with   New Patient (Initial Visit)    Pt in 13 alone Pt states here for weakness on left side of body     HISTORY OF PRESENT ILLNESS:  Discussed the use of AI scribe software for clinical note transcription with the patient, who gave verbal consent to proceed.  Collin [name] is a 63 year old male with history of hypertension, COPD who presents with episodes of left-sided weakness and dizziness.  He has been experiencing episodes of left-sided weakness since May. The first episode occurred at home, lasting approximately 30 seconds to a minute, during which he felt like he was going to tip over to the left. He describes losing power in his left leg and hand, resulting in one fall. Since then, he has experienced similar episodes seven or eight times, including while driving and during a motorcycle trip. During these episodes, he feels as though his car or motorcycle is pulling to the left, and he has to correct using his right hand. The most recent episode occurred four weeks ago during a motorcycle trip in [place] .  He reports numbness in his right hand, particularly in three fingers, which started three months ago and persists despite chiropractic adjustments. No accidents leading to this numbness.  He has a history of hypertension and is currently on Losartan. He has been monitoring his blood pressure, which has been elevated, ranging from 155-165/90-95 over the past couple of weeks, with a recent reading of 180/96 at the dentist.  During the review of symptoms, he reports dizziness during episodes of weakness. No change in sensation on the left side, room spinning sensation, changes in vision, or headaches. No history of heart disease or  neurological disease in his family.    OTHER MEDICAL CONDITIONS: Hypertension, COPD    REVIEW OF SYSTEMS: Full 14 system review of systems performed and negative with exception of: As noted in the HPI   ALLERGIES: No Known Allergies  HOME MEDICATIONS: Outpatient Medications Prior to Visit  Medication Sig Dispense Refill   albuterol  (VENTOLIN  HFA) 108 (90 Base) MCG/ACT inhaler Inhale 2 puffs into the lungs every 6 (six) hours as needed for wheezing or shortness of breath. 8 g 6   aspirin EC 81 MG tablet Take 81 mg by mouth daily. Swallow whole.     calcium carbonate (SUPER CALCIUM) 1500 (600 Ca) MG TABS tablet Take 600 mg of elemental calcium by mouth daily.     Cholecalciferol 50 MCG (2000 UT) CAPS Take 2,000 Units by mouth daily.     fenofibrate micronized (LOFIBRA) 134 MG capsule Take 134 mg by mouth daily.     fluticasone -salmeterol (ADVAIR HFA) 115-21 MCG/ACT inhaler Inhale 2 puffs into the lungs 2 (two) times daily. 24 g 3   losartan (COZAAR) 50 MG tablet Take 50 mg by mouth daily.     metroNIDAZOLE (METROGEL) 0.75 % gel Apply 1 Application topically in the morning.     vitamin E 180 MG (400 UNITS) capsule Take 400 Units by mouth daily.     No facility-administered medications prior to visit.    PAST MEDICAL HISTORY: Past Medical History:  Diagnosis Date   COPD (chronic obstructive pulmonary disease) (HCC)    Related to work environment. Not a smoker  Hypertension     PAST SURGICAL HISTORY: Past Surgical History:  Procedure Laterality Date   COLONOSCOPY  2023   REVERSE SHOULDER ARTHROPLASTY Bilateral    March and July 2023 By Dr. Cristy at [facility]   SUBMANDIBULAR GLAND EXCISION Right 01/29/2023   Procedure: EXCISION OF SUBMANDIBULAR GLAND;  Surgeon: Jesus Oliphant, MD;  Location: [facility] OR;  Service: ENT;  Laterality: Right;   TONSILLECTOMY     As a child    FAMILY HISTORY: Family History  Problem Relation Age of Onset   Stroke Maternal Grandfather     SOCIAL  HISTORY: Social History   Socioeconomic History   Marital status: Married    Spouse name: Not on file   Number of children: Not on file   Years of education: Not on file   Highest education level: Not on file  Occupational History   Not on file  Tobacco Use   Smoking status: Never    Passive exposure: Past   Smokeless tobacco: Never  Vaping Use   Vaping status: Never Used  Substance and Sexual Activity   Alcohol use: Yes    Comment: occasionally   Drug use: Never   Sexual activity: Yes  Other Topics Concern   Not on file  Social History Narrative   Married   Social Drivers of Health   Financial Resource Strain: Not on file  Food Insecurity: Low Risk  (11/09/2022)   Received from [facility]   Hunger Vital Sign    Within the past 12 months, you worried that your food would run out before you got money to buy more: Never true    Within the past 12 months, the food you bought just didn't last and you didn't have money to get more. : Never true  Transportation Needs: No Transportation Needs (11/09/2022)   Received from [facility]    In the past 12 months, has lack of reliable transportation kept you from medical appointments, meetings, work or from getting things needed for daily living? : No  Physical Activity: Not on file  Stress: Not on file  Social Connections: Unknown (10/13/2021)   Received from [facility]   Social Network    Social Network: Not on file  Intimate Partner Violence: Unknown (09/04/2021)   Received from [facility]   HITS    Physically Hurt: Not on file    Insult or Talk Down To: Not on file    Threaten Physical Harm: Not on file    Scream or Curse: Not on file    PHYSICAL EXAM  GENERAL EXAM/CONSTITUTIONAL: Vitals:  Vitals:   02/18/24 0941  BP: (!) 159/84  Pulse: (!) 56  Weight: 259 lb 8 oz (117.7 kg)  Height: 6' 7 (2.007 m)   Body mass index is 29.23 kg/m. Wt Readings from Last 3 Encounters:  02/18/24 259 lb 8  oz (117.7 kg)  11/15/23 261 lb (118.4 kg)  01/29/23 260 lb (117.9 kg)   Patient is in no distress; well developed, nourished and groomed; neck is supple  MUSCULOSKELETAL: Gait, strength, tone, movements noted in Neurologic exam below  NEUROLOGIC: MENTAL STATUS:      No data to display         awake, alert, oriented to person, place and time recent and remote memory intact normal attention and concentration language fluent, comprehension intact, naming intact fund of knowledge appropriate  CRANIAL NERVE:  2nd, 3rd, 4th, 6th - Visual fields full to confrontation, extraocular muscles intact,  no nystagmus 5th - facial sensation symmetric 7th - facial strength symmetric 8th - hearing intact 9th - palate elevates symmetrically, uvula midline 11th - shoulder shrug symmetric 12th - tongue protrusion midline  MOTOR:  normal bulk and tone, full strength in the BUE, BLE  SENSORY:  normal and symmetric to light touch  COORDINATION:  finger-nose-finger, fine finger movements normal  REFLEXES:  deep tendon reflexes present and symmetric  GAIT/STATION:  normal    DIAGNOSTIC DATA (LABS, IMAGING, TESTING) - I reviewed patient records, labs, notes, testing and imaging myself where available.  Lab Results  Component Value Date   WBC 7.3 01/21/2023   HGB 15.0 01/21/2023   HCT 45.7 01/21/2023   MCV 96.8 01/21/2023   PLT 232 01/21/2023      Component Value Date/Time   NA 135 01/21/2023 0849   K 4.0 01/21/2023 0849   CL 103 01/21/2023 0849   CO2 25 01/21/2023 0849   GLUCOSE 104 (H) 01/21/2023 0849   BUN 20 01/21/2023 0849   CREATININE 1.30 (H) 01/21/2023 0849   CALCIUM 9.3 01/21/2023 0849   GFRNONAA >60 01/21/2023 0849   No results found for: CHOL, HDL, LDLCALC, LDLDIRECT, TRIG, CHOLHDL No results found for: YHAJ8R No results found for: VITAMINB12 No results found for: TSH    ASSESSMENT AND PLAN  63 y.o. year old male with history of  hypertension, COPD who is presenting with recurrent episodes of left sided weakness, left arm and left leg since May.   Recurrent transient left-sided weakness and dizziness Intermittent episodes of left-sided weakness and dizziness, occurring approximately seven to eight times since May. Episodes last 30 seconds to a minute, with no complete blackout. Differential diagnosis includes transient ischemic attacks (TIAs) due to possible narrowing of blood vessels causing decreased perfusion to the right side of the brain, or cervical spine issues causing nerve compression or other space occupying lesions. No associated headaches or significant sensory changes reported. - Order MRI of the brain and MRA of the neck to rule out cerebrovascular causes - If MRI and MRA are normal, consider MRI of the cervical spine to assess for spinal cord or nerve compression - Advise immediate hospital evaluation if symptoms recur for rapid testing to rule out acute events  Persistent numbness of right hand Persistent numbness in the right hand, particularly affecting three fingers, persisting for approximately three months. Symptoms improved after chiropractic adjustments but some numbness remains. No recent trauma reported. Possible nerve compression or neuropathy considered.  Hypertension Recent blood pressure readings of 155-165/90-95 mmHg. Currently on Losartan. Blood pressure monitoring ongoing. - Advise consultation with primary care physician for hypertension management  Bradycardia Heart rate recorded at 56 bpm. No history of low heart rate or symptoms suggestive of bradycardia-related issues. - If neurological workup is normal, consider referral to cardiology for cardiac monitoring     1. Left-sided weakness   2. Bradycardia      Patient Instructions  Will complete MRI brain, MRA head and neck to rule out central causes If MRI and MRA of the head and neck are negative, will consider MRI cervical  spine to rule out cervical spine pathology that can cause his symptoms.   If neurological workup negative, will consider cardiology evaluation, heart monitor due to low heart rate. Continue to follow with PCP Please go to the ED if symptoms do get worse Return sooner if worse    Orders Placed This Encounter  Procedures   MR BRAIN WO CONTRAST   MR ANGIO  HEAD WO CONTRAST   MR ANGIO NECK W WO CONTRAST    No orders of the defined types were placed in this encounter.   Return if symptoms worsen or fail to improve.    Pastor Falling, MD 02/19/2024, 2:50 PM  [facility] [address] [place], [place] [address] [phone]

## 2024-02-19 NOTE — Patient Instructions (Signed)
 Will complete MRI brain, MRA head and neck to rule out central causes If MRI and MRA of the head and neck are negative, will consider MRI cervical spine to rule out cervical spine pathology that can cause his symptoms.   If neurological workup negative, will consider cardiology evaluation, heart monitor due to low heart rate. Continue to follow with PCP Please go to the ED if symptoms do get worse Return sooner if worse

## 2024-02-23 ENCOUNTER — Other Ambulatory Visit (INDEPENDENT_AMBULATORY_CARE_PROVIDER_SITE_OTHER)

## 2024-02-23 ENCOUNTER — Ambulatory Visit: Payer: Self-pay | Admitting: Neurology

## 2024-02-23 DIAGNOSIS — R531 Weakness: Secondary | ICD-10-CM | POA: Diagnosis not present

## 2024-02-29 ENCOUNTER — Ambulatory Visit

## 2024-02-29 DIAGNOSIS — R531 Weakness: Secondary | ICD-10-CM | POA: Diagnosis not present

## 2024-02-29 MED ORDER — GADOBENATE DIMEGLUMINE 529 MG/ML IV SOLN
20.0000 mL | Freq: Once | INTRAVENOUS | Status: AC | PRN
  Administered 2024-02-29: 20 mL via INTRAVENOUS

## 2024-03-02 ENCOUNTER — Other Ambulatory Visit: Payer: Self-pay | Admitting: Neurology

## 2024-03-02 DIAGNOSIS — R531 Weakness: Secondary | ICD-10-CM

## 2024-03-08 ENCOUNTER — Ambulatory Visit (INDEPENDENT_AMBULATORY_CARE_PROVIDER_SITE_OTHER)

## 2024-03-08 DIAGNOSIS — R531 Weakness: Secondary | ICD-10-CM | POA: Diagnosis not present

## 2024-03-13 ENCOUNTER — Ambulatory Visit: Payer: Self-pay | Admitting: Neurology

## 2024-03-13 DIAGNOSIS — M5412 Radiculopathy, cervical region: Secondary | ICD-10-CM

## 2024-03-13 DIAGNOSIS — R531 Weakness: Secondary | ICD-10-CM

## 2024-03-15 ENCOUNTER — Other Ambulatory Visit: Payer: Self-pay

## 2024-03-15 ENCOUNTER — Ambulatory Visit: Attending: Neurology | Admitting: Physical Therapy

## 2024-03-15 ENCOUNTER — Encounter: Payer: Self-pay | Admitting: Physical Therapy

## 2024-03-15 DIAGNOSIS — R531 Weakness: Secondary | ICD-10-CM | POA: Diagnosis not present

## 2024-03-15 DIAGNOSIS — M5412 Radiculopathy, cervical region: Secondary | ICD-10-CM | POA: Insufficient documentation

## 2024-03-15 DIAGNOSIS — M62838 Other muscle spasm: Secondary | ICD-10-CM | POA: Diagnosis not present

## 2024-03-15 DIAGNOSIS — R293 Abnormal posture: Secondary | ICD-10-CM | POA: Insufficient documentation

## 2024-03-15 NOTE — Therapy (Signed)
 OUTPATIENT PHYSICAL THERAPY CERVICAL EVALUATION   Patient Name: Collin [name] MRN: [id number] DOB:March 18, 1961, 63 y.o., male Today's Date: 03/15/2024  END OF SESSION:  PT End of Session - 03/15/24 1423     Visit Number 1    Number of Visits 8    Date for Recertification  05/10/24    Authorization Type BCBS    PT Start Time 1315    PT Stop Time 1356    PT Time Calculation (min) 41 min    Activity Tolerance Patient tolerated treatment well    Behavior During Therapy WFL for tasks assessed/performed          Past Medical History:  Diagnosis Date   COPD (chronic obstructive pulmonary disease) (HCC)    Related to work environment. Not a smoker   Hypertension    Past Surgical History:  Procedure Laterality Date   COLONOSCOPY  2023   REVERSE SHOULDER ARTHROPLASTY Bilateral    March and July 2023 By Dr. Cristy at [facility]   SUBMANDIBULAR GLAND EXCISION Right 01/29/2023   Procedure: EXCISION OF SUBMANDIBULAR GLAND;  Surgeon: Jesus Oliphant, MD;  Location: [facility] OR;  Service: ENT;  Laterality: Right;   TONSILLECTOMY     As a child   There are no active problems to display for this patient.   PCP: [facility]  REFERRING PROVIDER: Gregg Lek  REFERRING DIAG: Lt sided weakness, cervical radiculopathy  THERAPY DIAG:  Other muscle spasm  Abnormal posture  Rationale for Evaluation and Treatment: Rehabilitation  ONSET DATE: May 2025  SUBJECTIVE:                                                                                                                                                                                                         SUBJECTIVE STATEMENT: Pt states that since May 2025 he has had 7-8 instances of the Lt side of his body not working and going dead. He feels off balance and that his UE and LE are not working correctly. He states he also has numbness in his Rt hand. He states he doesn't lose consciousness during these episodes and  is able to recall  them happening. He states the episodes only last a few seconds and strength fully returns after episodes. Pt has had normal brain MRI and angiogram. He had an MRI that showed some disc bulging in cervical spine. Hand dominance: Right  PERTINENT HISTORY:  Bilat reverse total shoulder  PAIN:  Are you having pain? No  PRECAUTIONS: None  RED FLAGS: None    WEIGHT BEARING RESTRICTIONS:  No  FALLS:  Has patient fallen in last 6 months? Yes. Number of falls number unknown  OCCUPATION: retired Nature conservation officer, enjoys motorcycle riding  PLOF: Independent  PATIENT GOALS: not have any more episodes  NEXT MD VISIT: PRN  OBJECTIVE:  Note: Objective measures were completed at Evaluation unless otherwise noted.  DIAGNOSTIC FINDINGS:  MRI cervical spine without contrast demonstrating: - At C3-4 disc bulging and facet hypertrophy with mild spinal stenosis and mild right and severe left foraminal stenosis. - At C4-5 disc bulging with mild spinal stenosis and no foraminal narrowing.  This MR angiogram of the neck arteries shows minimal stenosis at the origin of the right internal carotid artery and is otherwise normal. This finding is not hemodynamically significant.   This is a normal MR angiogram of the head   Unremarkable MRI scan of the brain without contrast.     COGNITION: Overall cognitive status: Within functional limits for tasks assessed  SENSATION: No change in numbness in Rt fingers with limb tension tests (ulnar, medial, radial)  POSTURE: rounded shoulders, forward head, and increased thoracic kyphosis  PALPATION: TTP suboccipitals Rt > Lt TTP bilat UT and levator Hypomobile T4-T1   CERVICAL ROM:   Active ROM A/PROM (deg) eval  Flexion 68  Extension 45  Right lateral flexion 40  Left lateral flexion 55  Right rotation 70  Left rotation 70   (Blank rows = not tested)   UPPER EXTREMITY MMT: LE MMT 4+/5 hip flex, knee ext, DF bilat MMT Right eval Left eval   Shoulder flexion 4+ 4+  Shoulder extension    Shoulder abduction 4+ 4+  Shoulder adduction    Shoulder extension    Shoulder internal rotation    Shoulder external rotation    Middle trapezius    Lower trapezius    Elbow flexion    Elbow extension    Wrist flexion    Wrist extension    Wrist ulnar deviation    Wrist radial deviation    Wrist pronation    Wrist supination    Grip strength 130# 130#   (Blank rows = not tested)  CERVICAL SPECIAL TESTS:  Upper limb tension test (ULTT): Negative    TREATMENT DATE: 03/15/24 See HEP                                                                                                                              Pt educated on PT POC and goals, HEP, rationale for treatment   PATIENT EDUCATION:  Education details: PT POC and goals, HEP Person educated: Patient Education method: Explanation, Demonstration, and Handouts Education comprehension: verbalized understanding and returned demonstration  HOME EXERCISE PROGRAM: Access Code: [email or web address] URL: [email or web address] Date: 03/15/2024 Prepared by: Darice Conine  Exercises - Gentle Levator Scapulae Stretch  - 1 x daily - 7 x weekly - 1 sets - 3 reps - 20-30 seconds hold - Seated Gentle Upper Trapezius Stretch  - 1 x daily -  7 x weekly - 1 sets - 3 reps - 20-30 seconds hold - Seated Cervical Retraction  - 1 x daily - 7 x weekly - 1 sets - 10 reps - Standing with Forearms Thoracic Rotation  - 1 x daily - 7 x weekly - 2 sets - 10 reps  ASSESSMENT:  CLINICAL IMPRESSION: Patient is a 63 y.o. male who was seen today for physical therapy evaluation and treatment for Lt sided weakness and cervical radiculopathy. He presents with recurrent episodes of going dead on Lt side which last a few seconds and come without warning. Pt has had MRI of neck and brain and Angiogram of neck and brain which are unremarkable. Pt presents with impaired posture, increased muscle spasticity  and thoracic hypomobility. He will benefit from skilled PT to address deficits and decrease recurrence of symptoms.   OBJECTIVE IMPAIRMENTS: decreased activity tolerance, increased muscle spasms, and postural dysfunction.   PARTICIPATION LIMITATIONS: community activity and occupation  PERSONAL FACTORS: 1 comorbidity: bilateral shoulder replacements are also affecting patient's functional outcome.   REHAB POTENTIAL: Good  CLINICAL DECISION MAKING: Evolving/moderate complexity  EVALUATION COMPLEXITY: Moderate   GOALS: Goals reviewed with patient? Yes  SHORT TERM GOALS: Target date: 04/12/2024    Pt will be independent in initial HEP Baseline:  Goal status: INITIAL    LONG TERM GOALS: Target date: 05/10/2024    Pt will be independent in advanced HEP Baseline:  Goal status: INITIAL  2.  Pt will report going 8 weeks without an episode Baseline:  Goal status: INITIAL  3.  Pt will demo Rt cervical lateral flexion to = Lt Baseline:  Goal status: INITIAL    PLAN:  PT FREQUENCY: 1x/week  PT DURATION: 8 weeks  PLANNED INTERVENTIONS: 97164- PT Re-evaluation, 97110-Therapeutic exercises, 97530- Therapeutic activity, [id number]- Neuromuscular re-education, 97535- Self Care, [id number]- Manual therapy, G0283- Electrical stimulation (unattended), [id number]- Electrical stimulation (manual), [id number]- Traction (mechanical), 20560 (1-2 muscles), 20561 (3+ muscles)- Dry Needling, Patient/Family education, Taping, Cryotherapy, and Moist heat  PLAN FOR NEXT SESSION: assess response to HEP, thoracic and cervical mobility   Casen Pryor, PT 03/15/2024, 2:25 PM

## 2024-03-28 ENCOUNTER — Ambulatory Visit: Payer: Self-pay | Admitting: Physical Therapy

## 2024-05-01 ENCOUNTER — Telehealth: Payer: Self-pay | Admitting: Emergency Medicine

## 2024-05-01 ENCOUNTER — Encounter: Payer: Self-pay | Admitting: Cardiovascular Disease

## 2024-05-01 ENCOUNTER — Ambulatory Visit: Attending: Cardiology | Admitting: Cardiovascular Disease

## 2024-05-01 VITALS — BP 151/64 | HR 51 | Ht 79.0 in | Wt 263.0 lb

## 2024-05-01 DIAGNOSIS — R001 Bradycardia, unspecified: Secondary | ICD-10-CM

## 2024-05-01 DIAGNOSIS — M489 Spondylopathy, unspecified: Secondary | ICD-10-CM

## 2024-05-01 DIAGNOSIS — E782 Mixed hyperlipidemia: Secondary | ICD-10-CM

## 2024-05-01 DIAGNOSIS — I10 Essential (primary) hypertension: Secondary | ICD-10-CM

## 2024-05-01 MED ORDER — LOSARTAN POTASSIUM 100 MG PO TABS: 100.0000 mg | ORAL_TABLET | Freq: Every day | ORAL | 3 refills | Status: DC

## 2024-05-01 MED ORDER — LOSARTAN POTASSIUM 100 MG PO TABS: 100.0000 mg | ORAL_TABLET | Freq: Every day | ORAL | 3 refills | Status: AC

## 2024-05-01 NOTE — Patient Instructions (Signed)
 Medication Instructions:  Increase Losartan to 100 mg daily *If you need a refill on your cardiac medications before your next appointment, please call your pharmacy*  Lab Work: None ordered If you have labs (blood work) drawn today and your tests are completely normal, you will receive your results only by: MyChart Message (if you have MyChart) OR A paper copy in the mail If you have any lab test that is abnormal or we need to change your treatment, we will call you to review the results.  Testing/Procedures: None ordered  Follow-Up: At [facility], you and your health needs are our priority.  As part of our continuing mission to provide you with exceptional heart care, our providers are all part of one team.  This team includes your primary Cardiologist (physician) and Advanced Practice Providers or APPs (Physician Assistants and Nurse Practitioners) who all work together to provide you with the care you need, when you need it.  Your next appointment:   3 year(s)  Provider:   Dr Francyne  We recommend signing up for the patient portal called MyChart.  Sign up information is provided on this After Visit Summary.  MyChart is used to connect with patients for Virtual Visits (Telemedicine).  Patients are able to view lab/test results, encounter notes, upcoming appointments, etc.  Non-urgent messages can be sent to your provider as well.   To learn more about what you can do with MyChart, go to [email or web address].

## 2024-05-01 NOTE — Telephone Encounter (Signed)
 Fax sent to PCP requesting last office visit notes and lipid panel results before starting the patient on Fenofibrate

## 2024-05-02 ENCOUNTER — Encounter: Payer: Self-pay | Admitting: Cardiovascular Disease
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
## 2024-05-16 DIAGNOSIS — Z6829 Body mass index (BMI) 29.0-29.9, adult: Secondary | ICD-10-CM | POA: Diagnosis not present

## 2024-05-16 DIAGNOSIS — M542 Cervicalgia: Secondary | ICD-10-CM | POA: Diagnosis not present

## 2024-06-16 ENCOUNTER — Other Ambulatory Visit: Payer: Self-pay | Admitting: Neurosurgery

## 2024-06-16 DIAGNOSIS — M542 Cervicalgia: Secondary | ICD-10-CM

## 2024-06-18 ENCOUNTER — Other Ambulatory Visit: Payer: Self-pay | Admitting: Pulmonary Disease

## 2024-06-18 DIAGNOSIS — J449 Chronic obstructive pulmonary disease, unspecified: Secondary | ICD-10-CM

## 2024-06-19 ENCOUNTER — Other Ambulatory Visit: Payer: Self-pay | Admitting: Pulmonary Disease

## 2024-06-19 DIAGNOSIS — J449 Chronic obstructive pulmonary disease, unspecified: Secondary | ICD-10-CM

## 2024-06-28 NOTE — Discharge Instructions (Signed)

## 2024-06-29 ENCOUNTER — Ambulatory Visit
Admission: RE | Admit: 2024-06-29 | Discharge: 2024-06-29 | Disposition: A | Discharge status: Home / Self Care | Source: Ambulatory Visit | Attending: Neurosurgery | Admitting: Neurosurgery

## 2024-06-29 DIAGNOSIS — M542 Cervicalgia: Secondary | ICD-10-CM

## 2024-06-29 MED ORDER — MEPERIDINE HCL 50 MG/ML IJ SOLN: 50.0000 mg | Freq: Once | INTRAMUSCULAR | Status: DC | PRN

## 2024-06-29 MED ORDER — ONDANSETRON HCL 4 MG/2ML IJ SOLN: 4.0000 mg | Freq: Once | INTRAMUSCULAR | Status: DC | PRN

## 2024-06-29 MED ORDER — DIAZEPAM 5 MG PO TABS: 10.0000 mg | ORAL_TABLET | Freq: Once | ORAL | Status: DC

## 2024-06-29 MED ORDER — IOPAMIDOL (ISOVUE-M 300) INJECTION 61%
10.0000 mL | Freq: Once | INTRAMUSCULAR | Status: AC
  Administered 2024-06-29: 10 mL via INTRATHECAL
